# Patient Record
Sex: Male | Born: 1960 | Race: Black or African American | Hispanic: No | Marital: Married | Smoking: Never smoker
Health system: Southern US, Community
[De-identification: ages and names within clinical notes are randomized; demographics above are authoritative.]

## PROBLEM LIST (undated history)

## (undated) DIAGNOSIS — N529 Male erectile dysfunction, unspecified: Secondary | ICD-10-CM

## (undated) DIAGNOSIS — F32A Depression, unspecified: Secondary | ICD-10-CM

## (undated) DIAGNOSIS — I10 Essential (primary) hypertension: Secondary | ICD-10-CM

## (undated) DIAGNOSIS — T7840XA Allergy, unspecified, initial encounter: Secondary | ICD-10-CM

## (undated) DIAGNOSIS — E785 Hyperlipidemia, unspecified: Secondary | ICD-10-CM

## (undated) DIAGNOSIS — M1990 Unspecified osteoarthritis, unspecified site: Secondary | ICD-10-CM

## (undated) DIAGNOSIS — J45909 Unspecified asthma, uncomplicated: Secondary | ICD-10-CM

## (undated) HISTORY — PX: OTHER SURGICAL HISTORY: SHX169

## (undated) HISTORY — DX: Depression, unspecified: F32.A

## (undated) HISTORY — DX: Male erectile dysfunction, unspecified: N52.9

## (undated) HISTORY — DX: Allergy, unspecified, initial encounter: T78.40XA

## (undated) HISTORY — DX: Essential (primary) hypertension: I10

## (undated) HISTORY — PX: TONSILLECTOMY: SUR1361

## (undated) HISTORY — DX: Hyperlipidemia, unspecified: E78.5

## (undated) HISTORY — PX: WISDOM TOOTH EXTRACTION: SHX21

## (undated) HISTORY — DX: Unspecified asthma, uncomplicated: J45.909

## (undated) HISTORY — DX: Unspecified osteoarthritis, unspecified site: M19.90

---

## 2000-08-05 ENCOUNTER — Encounter: Payer: Self-pay | Admitting: Specialist

## 2000-08-05 ENCOUNTER — Encounter: Admission: RE | Admit: 2000-08-05 | Discharge: 2000-08-05 | Payer: Self-pay | Admitting: Specialist

## 2001-04-02 ENCOUNTER — Ambulatory Visit (HOSPITAL_COMMUNITY): Admission: RE | Admit: 2001-04-02 | Discharge: 2001-04-02 | Payer: Self-pay | Admitting: Specialist

## 2002-02-25 ENCOUNTER — Encounter: Admission: RE | Admit: 2002-02-25 | Discharge: 2002-05-26 | Payer: Self-pay | Admitting: Internal Medicine

## 2004-05-01 ENCOUNTER — Ambulatory Visit (HOSPITAL_BASED_OUTPATIENT_CLINIC_OR_DEPARTMENT_OTHER): Admission: RE | Admit: 2004-05-01 | Discharge: 2004-05-01 | Payer: Self-pay | Admitting: Specialist

## 2004-11-13 ENCOUNTER — Ambulatory Visit: Payer: Self-pay | Admitting: Internal Medicine

## 2005-01-08 ENCOUNTER — Ambulatory Visit: Payer: Self-pay | Admitting: Internal Medicine

## 2006-04-01 ENCOUNTER — Ambulatory Visit: Payer: Self-pay | Admitting: Internal Medicine

## 2006-04-15 ENCOUNTER — Ambulatory Visit: Payer: Self-pay | Admitting: Internal Medicine

## 2006-05-31 ENCOUNTER — Ambulatory Visit (HOSPITAL_BASED_OUTPATIENT_CLINIC_OR_DEPARTMENT_OTHER): Admission: RE | Admit: 2006-05-31 | Discharge: 2006-05-31 | Payer: Self-pay | Admitting: Specialist

## 2006-06-04 ENCOUNTER — Ambulatory Visit: Payer: Self-pay | Admitting: Internal Medicine

## 2006-06-11 ENCOUNTER — Ambulatory Visit: Payer: Self-pay | Admitting: Internal Medicine

## 2007-05-12 ENCOUNTER — Encounter (INDEPENDENT_AMBULATORY_CARE_PROVIDER_SITE_OTHER): Payer: Self-pay

## 2007-07-08 DIAGNOSIS — E1169 Type 2 diabetes mellitus with other specified complication: Secondary | ICD-10-CM | POA: Insufficient documentation

## 2007-07-08 DIAGNOSIS — E119 Type 2 diabetes mellitus without complications: Secondary | ICD-10-CM

## 2007-07-08 DIAGNOSIS — I10 Essential (primary) hypertension: Secondary | ICD-10-CM | POA: Insufficient documentation

## 2008-03-11 ENCOUNTER — Encounter: Payer: Self-pay | Admitting: Internal Medicine

## 2008-05-28 ENCOUNTER — Ambulatory Visit: Payer: Self-pay | Admitting: Internal Medicine

## 2008-05-28 LAB — CONVERTED CEMR LAB
ALT: 48 units/L (ref 0–53)
AST: 30 units/L (ref 0–37)
Albumin: 4.3 g/dL (ref 3.5–5.2)
Alkaline Phosphatase: 88 units/L (ref 39–117)
BUN: 15 mg/dL (ref 6–23)
Basophils Absolute: 0 10*3/uL (ref 0.0–0.1)
Basophils Relative: 0.5 % (ref 0.0–3.0)
Bilirubin Urine: NEGATIVE
Bilirubin, Direct: 0.1 mg/dL (ref 0.0–0.3)
Blood in Urine, dipstick: NEGATIVE
CO2: 30 meq/L (ref 19–32)
Calcium: 9.5 mg/dL (ref 8.4–10.5)
Chloride: 104 meq/L (ref 96–112)
Cholesterol: 148 mg/dL (ref 0–200)
Creatinine, Ser: 1.1 mg/dL (ref 0.4–1.5)
Eosinophils Absolute: 0.1 10*3/uL (ref 0.0–0.7)
Eosinophils Relative: 2.4 % (ref 0.0–5.0)
GFR calc Af Amer: 93 mL/min
GFR calc non Af Amer: 77 mL/min
Glucose, Bld: 137 mg/dL — ABNORMAL HIGH (ref 70–99)
Glucose, Urine, Semiquant: NEGATIVE
HCT: 39.5 % (ref 39.0–52.0)
HDL: 23.9 mg/dL — ABNORMAL LOW (ref >39.0)
Hemoglobin: 14.2 g/dL (ref 13.0–17.0)
Hgb A1c MFr Bld: 6.5 % — ABNORMAL HIGH (ref 4.6–6.0)
LDL Cholesterol: 109 mg/dL — ABNORMAL HIGH (ref 0–99)
Lymphocytes Relative: 25.3 % (ref 12.0–46.0)
MCHC: 35.8 g/dL (ref 30.0–36.0)
MCV: 94.9 fL (ref 78.0–100.0)
Monocytes Absolute: 0.4 10*3/uL (ref 0.1–1.0)
Monocytes Relative: 8.7 % (ref 3.0–12.0)
Neutro Abs: 2.6 10*3/uL (ref 1.4–7.7)
Neutrophils Relative %: 63.1 % (ref 43.0–77.0)
Nitrite: NEGATIVE
PSA: 1 ng/mL (ref 0.10–4.00)
Platelets: 229 10*3/uL (ref 150–400)
Potassium: 3.7 meq/L (ref 3.5–5.1)
Protein, U semiquant: NEGATIVE
RBC: 4.16 M/uL — ABNORMAL LOW (ref 4.22–5.81)
RDW: 11.9 % (ref 11.5–14.6)
Sodium: 142 meq/L (ref 135–145)
Specific Gravity, Urine: 1.01
TSH: 1.09 microintl units/mL (ref 0.35–5.50)
Total Bilirubin: 1.5 mg/dL — ABNORMAL HIGH (ref 0.3–1.2)
Total CHOL/HDL Ratio: 6.2
Total Protein: 7.5 g/dL (ref 6.0–8.3)
Triglycerides: 75 mg/dL (ref 0–149)
Urobilinogen, UA: 1
VLDL: 15 mg/dL (ref 0–40)
WBC Urine, dipstick: NEGATIVE
WBC: 4.2 10*3/uL — ABNORMAL LOW (ref 4.5–10.5)
pH: 6

## 2008-06-04 ENCOUNTER — Ambulatory Visit: Payer: Self-pay | Admitting: Internal Medicine

## 2008-07-14 ENCOUNTER — Telehealth: Payer: Self-pay | Admitting: Internal Medicine

## 2008-12-31 ENCOUNTER — Ambulatory Visit: Payer: Self-pay | Admitting: Internal Medicine

## 2008-12-31 LAB — CONVERTED CEMR LAB: Hgb A1c MFr Bld: 7.2 % — ABNORMAL HIGH (ref 4.6–6.5)

## 2009-01-13 ENCOUNTER — Ambulatory Visit: Payer: Self-pay | Admitting: Internal Medicine

## 2009-04-04 ENCOUNTER — Ambulatory Visit: Payer: Self-pay | Admitting: Internal Medicine

## 2009-04-04 LAB — CONVERTED CEMR LAB
BUN: 13 mg/dL (ref 6–23)
CO2: 32 meq/L (ref 19–32)
Calcium: 9.5 mg/dL (ref 8.4–10.5)
Chloride: 105 meq/L (ref 96–112)
Creatinine, Ser: 1.2 mg/dL (ref 0.4–1.5)
GFR calc non Af Amer: 83.42 mL/min (ref >60)
Glucose, Bld: 178 mg/dL — ABNORMAL HIGH (ref 70–99)
Hgb A1c MFr Bld: 6.2 % (ref 4.6–6.5)
Potassium: 4.8 meq/L (ref 3.5–5.1)
Sodium: 143 meq/L (ref 135–145)

## 2009-05-05 ENCOUNTER — Ambulatory Visit: Payer: Self-pay | Admitting: Internal Medicine

## 2009-09-07 ENCOUNTER — Ambulatory Visit: Payer: Self-pay | Admitting: Internal Medicine

## 2009-09-07 LAB — CONVERTED CEMR LAB
ALT: 39 units/L (ref 0–53)
AST: 24 units/L (ref 0–37)
Albumin: 4.1 g/dL (ref 3.5–5.2)
Alkaline Phosphatase: 90 units/L (ref 39–117)
Basophils Absolute: 0 10*3/uL (ref 0.0–0.1)
Basophils Relative: 0.5 % (ref 0.0–3.0)
Bilirubin, Direct: 0.1 mg/dL (ref 0.0–0.3)
Cholesterol: 149 mg/dL (ref 0–200)
Eosinophils Absolute: 0.1 10*3/uL (ref 0.0–0.7)
Eosinophils Relative: 3.2 % (ref 0.0–5.0)
HCT: 43.1 % (ref 39.0–52.0)
HDL: 27.6 mg/dL — ABNORMAL LOW (ref >39.00)
Hemoglobin: 14.6 g/dL (ref 13.0–17.0)
LDL Cholesterol: 99 mg/dL (ref 0–99)
Lymphocytes Relative: 34.8 % (ref 12.0–46.0)
Lymphs Abs: 1.3 10*3/uL (ref 0.7–4.0)
MCHC: 34 g/dL (ref 30.0–36.0)
MCV: 95.8 fL (ref 78.0–100.0)
Monocytes Absolute: 0.4 10*3/uL (ref 0.1–1.0)
Monocytes Relative: 11.4 % (ref 3.0–12.0)
Neutro Abs: 1.9 10*3/uL (ref 1.4–7.7)
Neutrophils Relative %: 50.1 % (ref 43.0–77.0)
PSA: 0.93 ng/mL (ref 0.10–4.00)
Platelets: 186 10*3/uL (ref 150.0–400.0)
RBC: 4.5 M/uL (ref 4.22–5.81)
RDW: 12.2 % (ref 11.5–14.6)
Total Bilirubin: 1.4 mg/dL — ABNORMAL HIGH (ref 0.3–1.2)
Total CHOL/HDL Ratio: 5
Total Protein: 7 g/dL (ref 6.0–8.3)
Triglycerides: 114 mg/dL (ref 0.0–149.0)
VLDL: 22.8 mg/dL (ref 0.0–40.0)
WBC: 3.7 10*3/uL — ABNORMAL LOW (ref 4.5–10.5)

## 2010-04-17 ENCOUNTER — Encounter: Payer: Self-pay | Admitting: Internal Medicine

## 2010-04-17 ENCOUNTER — Telehealth (INDEPENDENT_AMBULATORY_CARE_PROVIDER_SITE_OTHER): Payer: Self-pay | Admitting: *Deleted

## 2010-05-03 ENCOUNTER — Telehealth: Payer: Self-pay | Admitting: Internal Medicine

## 2010-05-03 ENCOUNTER — Ambulatory Visit: Payer: Self-pay | Admitting: Internal Medicine

## 2010-05-03 DIAGNOSIS — L600 Ingrowing nail: Secondary | ICD-10-CM | POA: Insufficient documentation

## 2010-11-09 NOTE — Letter (Signed)
Summary: Triage Note-Painful right toe  Triage Note-Painful right toe   Imported By: Maryln Gottron 05/02/2010 15:46:51  _____________________________________________________________________  External Attachment:    Type:   Image     Comment:   External Document

## 2010-11-09 NOTE — Assessment & Plan Note (Signed)
Summary: excision toenail/dm   Vital Signs:  Patient profile:   50 year old male Weight:      210 pounds Pulse rate:   89 / minute BP sitting:   130 / 100  (left arm) Cuff size:   regular CC: Right big toe nail infected, src   CC:  Right big toe nail infected and src.  History of Present Illness: pt had one week hx of painfull swollen toe HX of DM wears steel toed shoes at work the pain has not been   Problems Prior to Update: 1)  Preventive Health Care  (ICD-V70.0) 2)  Family History Diabetes 1st Degree Relative  (ICD-V18.0) 3)  Hypertension  (ICD-401.9) 4)  Diabetes Mellitus, Type II  (ICD-250.00)  Medications Prior to Update: 1)  Benicar Hct 20-12.5 Mg  Tabs (Olmesartan Medoxomil-Hctz) .Marland Kitchen.. 1 Once Daily 2)  Metformin Hcl 500 Mg Xr24h-Tab (Metformin Hcl) .... Two By Mouth Q Hs 3)  Keflex 500 Mg Caps (Cephalexin) .... One By Mouth Two Times A Day X 7 Days  Current Medications (verified): 1)  Benicar Hct 20-12.5 Mg  Tabs (Olmesartan Medoxomil-Hctz) .Marland Kitchen.. 1 Once Daily 2)  Metformin Hcl 500 Mg Xr24h-Tab (Metformin Hcl) .... Two By Mouth Q Hs  Allergies (verified): No Known Drug Allergies  Past History:  Family History: Last updated: 07/08/2007 Family History of Lupus Family History Diabetes 1st degree relative  Social History: Last updated: 07/08/2007 Never Smoked Alcohol use-no  Risk Factors: Smoking Status: never (07/08/2007)  Past medical, surgical, family and social histories (including risk factors) reviewed, and no changes noted (except as noted below).  Past Medical History: Reviewed history from 07/08/2007 and no changes required. Diabetes mellitus, type II Hypertension  Past Surgical History: Reviewed history from 07/08/2007 and no changes required. L Knee Surgery  Family History: Reviewed history from 07/08/2007 and no changes required. Family History of Lupus Family History Diabetes 1st degree relative  Social History: Reviewed history  from 07/08/2007 and no changes required. Never Smoked Alcohol use-no  Review of Systems  The patient denies anorexia, fever, weight loss, weight gain, vision loss, decreased hearing, hoarseness, chest pain, syncope, dyspnea on exertion, peripheral edema, prolonged cough, headaches, hemoptysis, abdominal pain, melena, hematochezia, severe indigestion/heartburn, hematuria, incontinence, genital sores, muscle weakness, suspicious skin lesions, transient blindness, difficulty walking, depression, unusual weight change, abnormal bleeding, enlarged lymph nodes, angioedema, and breast masses.    Physical Exam  General:  Well-developed,well-nourished,in no acute distress; alert,appropriate and cooperative throughout examination Head:  Normocephalic and atraumatic without obvious abnormalities. No apparent alopecia or balding. Eyes:  pupils equal and pupils round.   Ears:  R ear normal and L ear normal.   Nose:  no external deformity and no nasal discharge.   Lungs:  Normal respiratory effort, chest expands symmetrically. Lungs are clear to auscultation, no crackles or wheezes. Heart:  Normal rate and regular rhythm. S1 and S2 normal without gallop, murmur, click, rub or other extra sounds. Abdomen:  Bowel sounds positive,abdomen soft and non-tender without masses, organomegaly or hernias noted. Extremities:  infected toe nail on right foot   Impression & Recommendations:  Problem # 1:  INGROWN NAIL, INFECTED (ICD-703.0)  the site was cleated with betadyne and anesthesia was obtained with 1% lidocaine 1/4 of the nail was removed from the right great toe and the nail bed was cauterized The following medications were removed from the medication list:    Keflex 500 Mg Caps (Cephalexin) ..... One by mouth two times a day x 7  days  Discussed nail care. Schedule an appointment for partial nail plate removal.   Orders: Nail excision, partial or complete, permanent (11750)  Problem # 2:  DIABETES  MELLITUS, TYPE II (ICD-250.00)  single does antibiotic  His updated medication list for this problem includes:    Benicar Hct 20-12.5 Mg Tabs (Olmesartan medoxomil-hctz) .Marland Kitchen... 1 once daily    Metformin Hcl 500 Mg Xr24h-tab (Metformin hcl) .Marland Kitchen..Marland Kitchen Two by mouth q hs  Labs Reviewed: Creat: 1.2 (04/04/2009)    Reviewed HgBA1c results: 6.2 (04/04/2009)  7.2 (12/31/2008)  Complete Medication List: 1)  Benicar Hct 20-12.5 Mg Tabs (Olmesartan medoxomil-hctz) .Marland Kitchen.. 1 once daily 2)  Metformin Hcl 500 Mg Xr24h-tab (Metformin hcl) .... Two by mouth q hs  Patient Instructions: 1)  keep the bandage on tonight and the foot elevated 2)  take the antibiotics i gave you tonight with a meal

## 2010-11-09 NOTE — Progress Notes (Signed)
Summary: vicodin  Phone Note Call from Patient Call back at Home Phone [phone] Call back at [phone] c   Summary of Call: wants pain pills for toe.  Hurting bad.  Took [facility] Pain Relief ASA, but needs more.  [facility] [address]  NKDA.   Initial call taken by: Rudy Jew, RN,  May 03, 2010 4:52 PM  Follow-up for Phone Call        Per Dr. Shela Commons may have Vicodin 5-500 one by mouth q 6 hr as needed pain #20.   Wife says to call to [phone].  Done Follow-up by: Rudy Jew, RN,  May 03, 2010 5:04 PM    New/Updated Medications: VICODIN 5-500 MG TABS (HYDROCODONE-ACETAMINOPHEN) One by mouth every 6 hours as needed pain Prescriptions: VICODIN 5-500 MG TABS (HYDROCODONE-ACETAMINOPHEN) One by mouth every 6 hours as needed pain  #20 x 0   Entered by:   Rudy Jew, RN   Authorized by:   Stacie Glaze MD   Signed by:   Rudy Jew, RN on 05/03/2010   Method used:   Telephoned to ...       [facility] [address] [facility] [address] (retail)       [address] [place], [place]  [address]       Ph: [phone]       Fax: [phone]   RxID:   [id number]

## 2010-11-09 NOTE — Progress Notes (Signed)
  Phone Note Call from Patient   Caller: Pt walks in Call For: Stacie Glaze MD Summary of Call: Pt walks in with infected ingrown toenail. Per Dr. Lovell Sheehan :: Keflex 500 mg. one by mouth three times a day x 7 days and appt 2 weeks for excision of toenail. Initial call taken by: Lynann Beaver CMA,  April 17, 2010 9:12 AM    New/Updated Medications: KEFLEX 500 MG CAPS (CEPHALEXIN) one by mouth two times a day x 7 days Prescriptions: KEFLEX 500 MG CAPS (CEPHALEXIN) one by mouth two times a day x 7 days  #14 x 0   Entered by:   Lynann Beaver CMA   Authorized by:   Stacie Glaze MD   Signed by:   Lynann Beaver CMA on 04/17/2010   Method used:   Print then Give to Patient   RxID:   [id number] KEFLEX 500 MG CAPS (CEPHALEXIN) one by mouth two times a day x 7 days  #14 x 0   Entered by:   Lynann Beaver CMA   Authorized by:   Stacie Glaze MD   Signed by:   Lynann Beaver CMA on 04/17/2010   Method used:   Electronically to        [facility] [phone]* (retail)       [address]       [place], [place]  [address]       Ph: [phone]       Fax: [phone]   RxID:   [id number]

## 2010-11-14 ENCOUNTER — Other Ambulatory Visit: Payer: Self-pay | Admitting: Internal Medicine

## 2010-11-14 DIAGNOSIS — I10 Essential (primary) hypertension: Secondary | ICD-10-CM

## 2010-11-14 MED ORDER — OLMESARTAN MEDOXOMIL-HCTZ 20-12.5 MG PO TABS: 1.0000 | ORAL_TABLET | Freq: Every day | ORAL | Status: DC

## 2011-02-23 NOTE — Op Note (Signed)
[facility]  Patient:    Brent Cook, Brent Cook                      MRN: [id number] Proc. Date: 04/02/01 Adm. Date:  [id number] Attending:  Pierce Crane                           Operative Report  PREOPERATIVE DIAGNOSIS:  Post-traumatic chondromalacia left knee.  POSTOPERATIVE DIAGNOSES:  Post-traumatic chondromalacia left knee, post-traumatic chondromalacia of the femoral sulcus.  PROCEDURES:  Left knee arthroscopy, chondroplasty of the patella, femoral sulcus and medial femoral condyle, debridement of synovitis.  ANESTHESIA:  General.  BRIEF HISTORY:  This is the case of a 50 year old with refractory knee pain following a work-related injury. MRI indicating tricompartment osteoarthrosis and chondromalacia post-traumatic. The patient failed conservative treatment and therefore was a candidate for arthroscopic debridement. The risks and benefits were discussed including bleeding, infection, damage to neurovascular structures, no change in symptoms and need for repeat arthroscopic debridement in the future for persistent pain, etc.  TECHNIQUE:  With the patient in the supine position after induction of adequate general anesthesia and 1 gm of Kefzol, the left lower extremity was prepped and draped in the usual sterile fashion. A lateral parapatellar portal and the superomedial parapatellar portal was fashioned with a #11 blade. An ingress cannula atraumatically placed. Irrigant was utilized to insufflate the joint.  Arthroscopic camera was then inserted via the lateral portal and under direct visualization, a medial parapatellar portal was fashioned with a #11 blade sparing the medial meniscus. Inspection of the suprapatellar pouch revealed extensive grade 3 changes of the patella and of the femoral sulcus with chondral flap tears at the femoral sulcus both medially and laterally. The medial compartment revealed hypertrophic synovitis as well as  in the intercondylar notch. The meniscus was stable to probing and palpation without evidence of a tear. There was a flap tear on the medial femoral condyle posteriorly and centrally. ACL and PCL were unremarkable and stable to probing and palpation without evidence of tear. The lateral compartment revealed some grade 2 changes throughout a stable meniscus without a tear. The gutters were unremarkable. A 4/2 Kuda shaver was then introduced via the medial parapatellar portal utilizing the four chondroplasties of the patella, femoral sulcus and medial femoral condyle. A straight basket was utilized to remove chondral flap tears of the sulcus entering from the medial and lateral portal to optimize access. Next a full-radius resector was then introduced via the medial parapatellar portal and utilized to further refine the above mentioned chondroplasties. The knee was then copiously irrigated and all compartments reinspected. There was normal patellofemoral tracking. There was no further loose cartilaginous debris, flap tears, etc. The extensive changes, however, especially of the femoral sulcus and of the patella were of concern. There was no grade 4 changes; however, there was some minor cartilage present upon the femoral sulcus.  Next, the knee was copiously irrigated, all instrumentation was removed. The portals were closed with 4-0 nylon simple sutures, the wound was dressed sterilely, secured with an Ace bandage. The patient was awakened without difficulty and transported to the recovery room in satisfactory condition. The patient tolerated the procedure well with no complications. DD:  04/02/01 TD:  04/02/01 Job: [id number]

## 2011-02-23 NOTE — H&P (Signed)
[facility]  Patient:    Brent Cook, Brent Cook                           MRN: [id number] Adm. Date:  04/02/01 Attending:  Javier Docker, M.D. Dictator:   Alexzandrew L. Perkins, P.A.-C.                         History and Physical  CHIEF COMPLAINT:  Left knee pain.  HISTORY OF PRESENT ILLNESS:  The patient is a 50 year old male who has been seen and evaluated by Dr. Javier Docker for left knee pain.  He sustained injury back on the job when he was carrying 50 pound weights and was standing on a conveyer belt.  He felt a pop in his knee and since that time he has been having increasing knee pain.  He has undergone previous conservative treatment by Vira Browns in the past.  He has undergone anti-inflammatories and also intra-articular injections of steroids and also a treatment of hyalgan which only provided temporary relief.  He was sent for an MRI which demonstrated tricompartmental chondromalacia; no significant meniscal tear was appreciated on the MRI.  He was later referred to Dr. Shelle Iron for evaluation.  Due to the fact that he has not improved with conservative measures, it is felt that he may benefit from undergoing arthroscopic debridement.  Risks and benefits of the procedure were discussed with the patient, and he has elected to proceed with surgery.  ALLERGIES:  No known drug allergies.  CURRENT MEDICATIONS:  He does have medications for Vicodin 5 mg and also Vioxx 25 which he states he was scheduled to use these after surgery.  PAST MEDICAL HISTORY:  Childhood asthma.  PAST SURGICAL HISTORY:  Tonsillectomy.  SOCIAL HISTORY:  He is married, has one child, and denies tobacco products or alcohol products.  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  FAMILY HISTORY:  Father living, age 64 and mother living age 51.  Family history of hypertension.  REVIEW OF SYSTEMS:  GENERAL:  No fevers or chills or night sweats. NEUROLOGIC:  No  seizure, syncope, or paralysis.  RESPIRATORY:  No shortness of breath, productive cough, or hemoptysis.  CARDIOVASCULAR:  No chest pain or orthopnea.  GI:  No nausea or vomiting or constipation.  GU:  No dysuria, hematuria, or discharge.  MUSCULOSKELETAL:  Pertinent to the left knee found in the history of present illness.  PHYSICAL EXAMINATION:  VITAL SIGNS:  Pulse 64, respirations 16, blood pressure 132/80.  GENERAL:  The patient is a 50 year old male, well-developed, well-nourished, appears to be in no acute distress.  He is alert, oriented, and cooperative. A large framed, very muscular individual.  HEENT:  Normocephalic, atraumatic.  Pupils round and reactive.  Oropharynx is clear.  EOMs intact.  NECK:  Supple, no carotid bruits.  LUNGS:  Clear to auscultation and percussion, anterior and posterior.  HEART:  Regular rate and rhythm, no murmurs, S1, S2 noted, no rubs, thrills, or palpitations.  ABDOMEN:  Soft, slightly round, nontender, bowel sounds are present.  RECTAL/BREAST/GENITALIA:  Not done, not pertinent to present illness.  EXTREMITIES:  Significant to the left lower extremity.  The patient has range of motion of 0-140 degrees of flexion.  Negative Lachman, negative anterior/posterior drawer, negative McMurray.  Normal tracking patella with normal patella tilt.  He does ambulate slightly with an antalgic gait.  He does have  trace effusion.  No ecchymosis or deformity noted.  He does have some pain with patella femoral compression and also some pain over the medial joint line.  IMPRESSION:  Left knee osteoarthritis.  PLAN:  The patient will undergo a left knee arthroscopy and debridement. Surgery will be performed by Dr. Jene Every. DD:  04/01/01 TD:  04/01/01 Job: [id number]

## 2011-02-23 NOTE — Op Note (Signed)
NAME:  Brent Cook, Brent Cook                         ACCOUNT NO.:  [id number]   MEDICAL RECORD NO.:  [id number]                   PATIENT TYPE:  AMB   LOCATION:  [facility]                                 FACILITY:  [facility]   PHYSICIAN:  Jene Every, M.D.                 DATE OF BIRTH:  30-Nov-1960   DATE OF PROCEDURE:  05/01/2004  DATE OF DISCHARGE:                                 OPERATIVE REPORT   PREOPERATIVE DIAGNOSES:  1. Post-traumatic chondromalacia, left knee.  2. Loose cartilaginous bodies.   POSTOPERATIVE DIAGNOSES:  1. Post-traumatic chondromalacia, left knee.  2. Loose cartilaginous bodies.   PROCEDURES PERFORMED:  Left knee arthroscopy:, chondroplasty of the patella.  1. Chondroplasty of the femoral sulcus.  2. Chondroplasty of the medial femoral condyle.  3. Chondroplasty of the medial tibial plateau.  4. Removal of loose bodies.   ANESTHESIA:  General.   BRIEF HISTORY AND INDICATIONS:  This is a 50 year old with refractory knee  pain, status post trauma to his left knee with loose cartilaginous debris.  MRI indicating defect of the medial femoral condyle, patellofemoral  arthrosis was noted due to refractory pain.  The patient was indicated for  arthroscopic debridement.  Risks and benefits discussed, including bleeding,  infection, damage to vascular structures, no change in symptoms, worsening  of symptoms, need for repeat debridement, etc.   TECHNIQUE:  Patient in supine position.  After induction of adequate level  of general anesthesia, 1 g Kefzol, the left lower extremity was prepped and  draped in the usual sterile fashion.  A medial lateral peripatellar portal  and a superior medial parapatellar portal was fashioned with a #11 blade and  ingress cannula atraumatically placed.  Irrigant was utilized to insufflate  the joint.  Arthroscopic camera was then inserted via the lateral portal and  under direct visualization the medial parapatellar portal was fashioned  with  a #11 blade after localization with an 18-gauge needle, sparing the medial  meniscus.  Inspection in the suprapatellar pouch revealed extensive grade 3  changes of the femoral sulcus and of the patella.  There were loose  cartilaginous bodies noted.  Examination of the medial compartment revealed  extensive grade 3 and almost grade 4 lesion in the posterior weightbearing  surface of the femoral condyle.  There were some grade 3 changes of the  tibial plateau noted as well.  Mild fraying of the meniscus.  ACL and PCL  were unremarkable.  The lateral compartment revealed some mild loose  cartilaginous debris and some grade 2 changes throughout.  The gutters were  unremarkable.  A shaver was introduced, utilized to perform a chondroplasty  of the patella, the femoral sulcus, the medial femoral condyle, and of the  tibial plateau, all to stable bases.  The loose cartilaginous debris was  evacuated.  These measured approximately 2 x 3 mm.  Following this we probed  both menisci.  Each was stable to probe palpation without evidence of tears.  We revisited all compartments without any further loose cartilaginous  debris.  There was normal patellofemoral tracking.  We then removed all  instrumentation and closed the portals with 4-0 nylon simple suture, 0.25%  Marcaine with epinephrine  was infiltrated in the joint.  The wound was dressed sterilely.  He was then  awoken without difficulty and transported to the recovery room in  satisfactory condition.   The patient tolerated the procedure well with no complication.                                               Jene Every, M.D.    Cordelia Pen  D:  05/01/2004  T:  05/01/2004  Job:  [id number]

## 2011-02-23 NOTE — Op Note (Signed)
Brent Cook, Brent Cook NO.:  [id number]   MEDICAL RECORD NO.:  [id number]          PATIENT TYPE:  AMB   LOCATION:  [facility]                         FACILITY:  [facility]   PHYSICIAN:  Jene Every, M.D.    DATE OF BIRTH:  May 01, 1961   DATE OF PROCEDURE:  05/31/2006  DATE OF DISCHARGE:                                 OPERATIVE REPORT   PREOPERATIVE DIAGNOSIS:  Post traumatic chondromalacia of the left knee  patellofemoral joint, medial femoral condyle, loose bodies.   POSTOPERATIVE DIAGNOSIS:  Post traumatic chondromalacia of the left knee  patellofemoral joint, medial femoral condyle, loose bodies.   PROCEDURE PERFORMED:  Left knee arthroscopy, chondroplasty of the patella,  femoral sulcus, medial femoral condyle, evacuation of loose bodies.   ANESTHESIA:  General.   SURGEON:  Jene Every, M.D.   ASSISTANT:  None.   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  Initially, he had large loose cartilaginous bodies  removed from the knee.  He has been treated for post traumatic arthritis  following that.  He had a recent escalation of his pain that was refractory  to conservative treatment including corticosteroid injections, physical  therapy, time, rest, etc.  He was indicated for arthroscopic debridement,  evacuation of loose bodies, and chondroplasty.  The risks and benefits were  discussed including bleeding, infection, damage to neurovascular structures,  no change in symptoms, worsening of symptoms, need for repeat debridement,  or total knee arthroplasty in the future, etc.   SURGICAL TECHNIQUE:  With the patient in the supine position, after adequate  of adequate general anesthesia and 1 gram Kefzol, the left lower extremity  was prepped and draped in the usual sterile fashion.  A lateral parapatellar  portal and superomedial  parapatellar portal was fashioned with a #11 blade.  Ingress cannula atraumatically placed.  Irrigant was utilized to insufflate  the joint.  Under direct visualization, a medial parapatellar portal was  fashioned with a #11 blade after localization of an 18 gauge needle sparing  the medial meniscus.  Noted was loose cartilaginous bodies throughout,  particularly in the medial compartment and the suprapatellar pouch.  These  were evacuated.  There was considerable grade 3 chondromalacia and loose  cartilaginous debris.  This was debrided.  Chondroplasties were performed of  the patella and the femoral sulcus.  Chondroplasty was performed of the  medial femoral condyle and the medial tibial plateau.  There was  predominantly chondroplasty on the femoral condyle noted.  ACL and PCL were  unremarkable, though the notch was narrowed.  The lateral compartment  revealed normal femoral condyle, tibial plateau, and lateral meniscus stable  to profile patient without evidence of tear.  Medial meniscus was without  evidence of significant tearing, just mild fraying was noted.  The gutters  were unremarkable, as well.  There was normal patellofemoral tracking.  Slight synovitis.  After copious lavage, all instrumentation was removed.  The portals were closed with 4-0 nylon simple suture.  0.25% Marcaine  with  epinephrine was infiltrated in the joint wound.  It was dressed sterilely.  He was awakened without difficulty and transported to the recovery room in  satisfactory condition.  The patient tolerated the procedure well without  complications.      Jene Every, M.D.  Electronically Signed     JB/MEDQ  D:  05/31/2006  T:  05/31/2006  Job:  [id number]

## 2011-03-27 ENCOUNTER — Other Ambulatory Visit: Payer: Self-pay | Admitting: Internal Medicine

## 2011-08-03 ENCOUNTER — Other Ambulatory Visit: Payer: Self-pay | Admitting: Internal Medicine

## 2011-08-20 ENCOUNTER — Telehealth: Payer: Self-pay | Admitting: Internal Medicine

## 2011-08-20 MED ORDER — OSELTAMIVIR PHOSPHATE 75 MG PO CAPS: 75.0000 mg | ORAL_CAPSULE | Freq: Two times a day (BID) | ORAL | Status: AC

## 2011-08-20 NOTE — Telephone Encounter (Signed)
Per dr Lovell Sheehan call in tamilfu 75 bid for 7 days=-pt informed and med sent in

## 2011-08-20 NOTE — Telephone Encounter (Signed)
Pt went to the nurse at his office and was told he could possibly have the flu. Pt was directed to see his PCP, I informed pt we were fun but could get him in in the morning. pt wanted to have meds called in for him and requested you contacted him

## 2011-08-22 ENCOUNTER — Other Ambulatory Visit: Payer: Self-pay

## 2011-09-03 ENCOUNTER — Encounter: Payer: Self-pay | Admitting: Internal Medicine

## 2011-11-01 ENCOUNTER — Other Ambulatory Visit (INDEPENDENT_AMBULATORY_CARE_PROVIDER_SITE_OTHER): Payer: 59

## 2011-11-01 DIAGNOSIS — Z0000 Encounter for general adult medical examination without abnormal findings: Secondary | ICD-10-CM

## 2011-11-01 LAB — LIPID PANEL
HDL: 27.7 mg/dL — ABNORMAL LOW (ref >39.00)
LDL Cholesterol: 85 mg/dL (ref 0–99)
Total CHOL/HDL Ratio: 5

## 2011-11-01 LAB — POCT URINALYSIS DIPSTICK
Blood, UA: NEGATIVE
Ketones, UA: NEGATIVE
Protein, UA: NEGATIVE
Spec Grav, UA: 1.02
pH, UA: 5.5

## 2011-11-01 LAB — CBC WITH DIFFERENTIAL/PLATELET
Basophils Absolute: 0 10*3/uL (ref 0.0–0.1)
Basophils Relative: 0.6 % (ref 0.0–3.0)
Hemoglobin: 14.3 g/dL (ref 13.0–17.0)
Lymphocytes Relative: 28.4 % (ref 12.0–46.0)
Monocytes Relative: 8.2 % (ref 3.0–12.0)
Neutro Abs: 2.6 10*3/uL (ref 1.4–7.7)
RBC: 4.33 Mil/uL (ref 4.22–5.81)
RDW: 12.9 % (ref 11.5–14.6)

## 2011-11-01 LAB — HEPATIC FUNCTION PANEL
AST: 17 U/L (ref 0–37)
Albumin: 4.3 g/dL (ref 3.5–5.2)
Alkaline Phosphatase: 90 U/L (ref 39–117)

## 2011-11-01 LAB — BASIC METABOLIC PANEL
Calcium: 9.5 mg/dL (ref 8.4–10.5)
GFR: 103.03 mL/min (ref >60.00)
Sodium: 136 mEq/L (ref 135–145)

## 2011-11-01 LAB — MICROALBUMIN / CREATININE URINE RATIO: Microalb, Ur: 0.7 mg/dL (ref 0.0–1.9)

## 2011-11-07 ENCOUNTER — Encounter: Payer: Self-pay | Admitting: Internal Medicine

## 2011-11-07 ENCOUNTER — Ambulatory Visit (INDEPENDENT_AMBULATORY_CARE_PROVIDER_SITE_OTHER): Payer: 59 | Admitting: Internal Medicine

## 2011-11-07 VITALS — BP 130/74 | HR 88 | Temp 98.3°F | Resp 16 | Ht 67.0 in | Wt 200.0 lb

## 2011-11-07 DIAGNOSIS — I10 Essential (primary) hypertension: Secondary | ICD-10-CM

## 2011-11-07 DIAGNOSIS — E119 Type 2 diabetes mellitus without complications: Secondary | ICD-10-CM

## 2011-11-07 DIAGNOSIS — Z0000 Encounter for general adult medical examination without abnormal findings: Secondary | ICD-10-CM

## 2011-11-07 DIAGNOSIS — Z23 Encounter for immunization: Secondary | ICD-10-CM

## 2011-11-07 NOTE — Patient Instructions (Signed)
Cut out the cookies!

## 2011-11-07 NOTE — Progress Notes (Signed)
Subjective:    Patient ID: Brent Cook, male    DOB: 25-Jul-1961, 51 y.o.   MRN: [id number]  HPI The pt presents for a CPX. Brent Cook has a history of hypertension and diabetes. He has a family history of heart disease but no personal history of any heart disease he has been stable his blood pressure medicine and this continues to be stable and compliant with those medications.  Screening blood work for his physical was generally normal with the exception of an elevated blood glucose. Blood count liver functions kidney functions were all good A1c was 7.2 which is a little high last year his A1c was 6.2.  He admits to having had cookies prior to his blood work drawn I would also explain why his triglycerides were up which they normally are not.     Review of Systems  Constitutional: Negative for fever and fatigue.  HENT: Negative for hearing loss, congestion, neck pain and postnasal drip.   Eyes: Negative for discharge, redness and visual disturbance.  Respiratory: Negative for cough, shortness of breath and wheezing.   Cardiovascular: Negative for leg swelling.  Gastrointestinal: Negative for abdominal pain, constipation and abdominal distention.  Genitourinary: Negative for urgency and frequency.  Musculoskeletal: Negative for joint swelling and arthralgias.  Skin: Negative for color change and rash.  Neurological: Negative for weakness and light-headedness.  Hematological: Negative for adenopathy.  Psychiatric/Behavioral: Negative for behavioral problems.       Past Medical History  Diagnosis Date  . Diabetes mellitus   . Hypertension     History   Social History  . Marital Status: Married    Spouse Name: N/A    Number of Children: N/A  . Years of Education: N/A   Occupational History  . Not on file.   Social History Main Topics  . Smoking status: Never Smoker   . Smokeless tobacco: Not on file  . Alcohol Use: No  . Drug Use: No  . Sexually Active: Not on file     Other Topics Concern  . Not on file   Social History Narrative  . No narrative on file    No past surgical history on file.  No family history on file.  No Known Allergies  Current Outpatient Prescriptions on File Prior to Visit  Medication Sig Dispense Refill  . GLUCOPHAGE XR 500 MG 24 hr tablet TAKE 2 TABLETS BY MOUTH DAILY AT BEDTIME  180 tablet  0  . olmesartan-hydrochlorothiazide (BENICAR HCT) 20-12.5 MG per tablet Take 1 tablet by mouth daily.  30 tablet  11    BP 130/74  Pulse 88  Temp 98.3 F (36.8 C)  Resp 16  Ht 5\' 7"  (1.702 m)  Wt 200 lb (90.719 kg)  BMI 31.32 kg/m2    Objective:   Physical Exam  Nursing note and vitals reviewed. Constitutional: He appears well-developed and well-nourished.  HENT:  Head: Normocephalic and atraumatic.  Eyes: Conjunctivae are normal. Pupils are equal, round, and reactive to light.  Neck: Normal range of motion. Neck supple.  Cardiovascular: Normal rate and regular rhythm.   Pulmonary/Chest: Effort normal and breath sounds normal.  Abdominal: Soft. Bowel sounds are normal.          Assessment & Plan:   Patient presents for yearly preventative medicine examination.   all immunizations and health maintenance protocols were reviewed with the patient and they are up to date with these protocols.   screening laboratory values were reviewed with the patient including screening  of hyperlipidemia PSA renal function and hepatic function.   There medications past medical history social history problem list and allergies were reviewed in detail.   Goals were established with regard to weight loss exercise diet in compliance with medications  Patient admits to dietary noncompliance he has been eating chocolate chip cookies that explains why his blood glucose is higher and his hemoglobin A1c which as been in the mid-6 range is now 7.2.  We discussed dietary compliance and its importance in controlling his diabetes and  preventing complications from his diabetes.  His hypertension is stable his current medication his metabolic panel shows an excellent creatinine and potassium level on an ARB  type drug

## 2011-11-26 ENCOUNTER — Other Ambulatory Visit: Payer: Self-pay | Admitting: Internal Medicine

## 2011-12-10 ENCOUNTER — Telehealth: Payer: Self-pay | Admitting: Internal Medicine

## 2011-12-10 MED ORDER — VARDENAFIL HCL 20 MG PO TABS: 20.0000 mg | ORAL_TABLET | Freq: Every day | ORAL | Status: DC | PRN

## 2011-12-10 NOTE — Telephone Encounter (Signed)
Pt called and said that he was given samples of Levitra to try. Pt said that med works well and now is req a script to be called in to [facility] on [address] in [place].

## 2011-12-11 ENCOUNTER — Telehealth: Payer: Self-pay | Admitting: Internal Medicine

## 2011-12-11 NOTE — Telephone Encounter (Signed)
Pt called and said that insurance will not cover Levitra. Pt needs an alternative med that insurance will cover. Pts pharmacy told pt to contact pcp for different med.

## 2011-12-12 ENCOUNTER — Other Ambulatory Visit: Payer: Self-pay | Admitting: *Deleted

## 2011-12-12 MED ORDER — SILDENAFIL CITRATE 100 MG PO TABS: 100.0000 mg | ORAL_TABLET | Freq: Every day | ORAL | Status: DC | PRN

## 2011-12-17 ENCOUNTER — Other Ambulatory Visit: Payer: Self-pay | Admitting: Internal Medicine

## 2012-03-24 ENCOUNTER — Other Ambulatory Visit: Payer: Self-pay | Admitting: Internal Medicine

## 2012-04-29 ENCOUNTER — Other Ambulatory Visit: Payer: 59

## 2012-05-06 ENCOUNTER — Ambulatory Visit: Payer: 59 | Admitting: Internal Medicine

## 2012-07-07 ENCOUNTER — Other Ambulatory Visit: Payer: Self-pay | Admitting: Internal Medicine

## 2012-11-13 ENCOUNTER — Other Ambulatory Visit: Payer: Self-pay | Admitting: Internal Medicine

## 2012-11-14 ENCOUNTER — Telehealth: Payer: Self-pay | Admitting: Internal Medicine

## 2012-11-14 MED ORDER — CANDESARTAN CILEXETIL-HCTZ 32-12.5 MG PO TABS: 1.0000 | ORAL_TABLET | Freq: Every day | ORAL | Status: DC

## 2012-11-14 NOTE — Telephone Encounter (Signed)
Pt needs samples of benicar 20-12.5mg  also pt needs coupons to help with cost of med

## 2012-11-14 NOTE — Telephone Encounter (Signed)
$  117 the cost of Benicar.  Can they try something else?

## 2012-11-14 NOTE — Telephone Encounter (Signed)
None available, but will send in script to pharmacy .  Has not been seen>1 years- needs ov

## 2012-11-14 NOTE — Telephone Encounter (Signed)
Please change to atacand 32-12.5 1 qd Wife informed

## 2013-01-06 ENCOUNTER — Other Ambulatory Visit: Payer: 59

## 2013-01-13 ENCOUNTER — Encounter: Payer: 59 | Admitting: Family

## 2013-03-12 ENCOUNTER — Other Ambulatory Visit: Payer: Self-pay | Admitting: Internal Medicine

## 2013-03-16 ENCOUNTER — Other Ambulatory Visit (INDEPENDENT_AMBULATORY_CARE_PROVIDER_SITE_OTHER): Payer: 59

## 2013-03-16 DIAGNOSIS — Z0000 Encounter for general adult medical examination without abnormal findings: Secondary | ICD-10-CM

## 2013-03-16 LAB — BASIC METABOLIC PANEL
BUN: 12 mg/dL (ref 6–23)
Calcium: 9.6 mg/dL (ref 8.4–10.5)
GFR: 118.94 mL/min (ref >60.00)
Glucose, Bld: 186 mg/dL — ABNORMAL HIGH (ref 70–99)
Sodium: 137 mEq/L (ref 135–145)

## 2013-03-16 LAB — POCT URINALYSIS DIPSTICK
Bilirubin, UA: NEGATIVE
Glucose, UA: NEGATIVE
Ketones, UA: NEGATIVE
Leukocytes, UA: NEGATIVE
Protein, UA: NEGATIVE

## 2013-03-16 LAB — CBC WITH DIFFERENTIAL/PLATELET
Basophils Absolute: 0 10*3/uL (ref 0.0–0.1)
Lymphocytes Relative: 27.6 % (ref 12.0–46.0)
Monocytes Relative: 8.2 % (ref 3.0–12.0)
Platelets: 199 10*3/uL (ref 150.0–400.0)
RDW: 12.6 % (ref 11.5–14.6)
WBC: 3.7 10*3/uL — ABNORMAL LOW (ref 4.5–10.5)

## 2013-03-16 LAB — LIPID PANEL
Cholesterol: 140 mg/dL (ref 0–200)
HDL: 26.3 mg/dL — ABNORMAL LOW (ref >39.00)
VLDL: 12.6 mg/dL (ref 0.0–40.0)

## 2013-03-16 LAB — HEPATIC FUNCTION PANEL
AST: 22 U/L (ref 0–37)
Alkaline Phosphatase: 85 U/L (ref 39–117)
Bilirubin, Direct: 0.2 mg/dL (ref 0.0–0.3)
Total Bilirubin: 1.1 mg/dL (ref 0.3–1.2)

## 2013-03-16 LAB — MICROALBUMIN / CREATININE URINE RATIO: Creatinine,U: 146.3 mg/dL

## 2013-03-20 ENCOUNTER — Ambulatory Visit (INDEPENDENT_AMBULATORY_CARE_PROVIDER_SITE_OTHER): Payer: 59 | Admitting: Internal Medicine

## 2013-03-20 ENCOUNTER — Encounter: Payer: Self-pay | Admitting: Internal Medicine

## 2013-03-20 VITALS — BP 130/80 | HR 76 | Temp 98.2°F | Resp 16 | Ht 67.0 in | Wt 202.0 lb

## 2013-03-20 DIAGNOSIS — Z0000 Encounter for general adult medical examination without abnormal findings: Secondary | ICD-10-CM

## 2013-03-20 NOTE — Progress Notes (Signed)
Subjective:    Patient ID: Brent Cook, male    DOB: 08-Feb-1961, 52 y.o.   MRN: [id number]  HPI  Patient with hypertension and diabetes presents for his yearly annual examination as part of his examination hemoglobin A1c was drawn which was 6.5  Review of Systems  Constitutional: Negative for fever and fatigue.  HENT: Negative for hearing loss, congestion, neck pain and postnasal drip.   Eyes: Negative for discharge, redness and visual disturbance.  Respiratory: Negative for cough, shortness of breath and wheezing.   Cardiovascular: Negative for leg swelling.  Gastrointestinal: Negative for abdominal pain, constipation and abdominal distention.  Genitourinary: Negative for urgency and frequency.  Musculoskeletal: Negative for joint swelling and arthralgias.  Skin: Negative for color change and rash.  Neurological: Negative for weakness and light-headedness.  Hematological: Negative for adenopathy.  Psychiatric/Behavioral: Negative for behavioral problems.   Past Medical History  Diagnosis Date  . Diabetes mellitus   . Hypertension     History   Social History  . Marital Status: Married    Spouse Name: N/A    Number of Children: N/A  . Years of Education: N/A   Occupational History  . Not on file.   Social History Main Topics  . Smoking status: Never Smoker   . Smokeless tobacco: Not on file  . Alcohol Use: No  . Drug Use: No  . Sexually Active: Not on file   Other Topics Concern  . Not on file   Social History Narrative  . No narrative on file    History reviewed. No pertinent past surgical history.  No family history on file.  No Known Allergies  Current Outpatient Prescriptions on File Prior to Visit  Medication Sig Dispense Refill  . candesartan-hydrochlorothiazide (ATACAND HCT) 32-12.5 MG per tablet Take 1 tablet by mouth daily.  30 tablet  6  . metFORMIN (GLUCOPHAGE-XR) 500 MG 24 hr tablet TAKE 2 TABLETS BY MOUTH DAILY AT BEDTIME  180 tablet  3   . sildenafil (VIAGRA) 100 MG tablet Take 1 tablet (100 mg total) by mouth daily as needed for erectile dysfunction.  5 tablet  11   No current facility-administered medications on file prior to visit.    BP 130/80  Pulse 76  Temp(Src) 98.2 F (36.8 C)  Resp 16  Ht 5\' 7"  (1.702 m)  Wt 202 lb (91.627 kg)  BMI 31.63 kg/m2        Objective:   Physical Exam  Vitals reviewed. Constitutional: He is oriented to person, place, and time. He appears well-developed and well-nourished.  HENT:  Head: Normocephalic and atraumatic.  Eyes: Conjunctivae are normal. Pupils are equal, round, and reactive to light.  Neck: Normal range of motion. Neck supple.  Cardiovascular: Normal rate and regular rhythm.   Pulmonary/Chest: Effort normal and breath sounds normal.  Abdominal: Soft. Bowel sounds are normal.  Genitourinary: Rectum normal and prostate normal.  Neurological: He is alert and oriented to person, place, and time.  Skin: Skin is warm and dry.  Psychiatric: He has a normal mood and affect. His behavior is normal.          Assessment & Plan:  Has lost significant weight which is about his A1c to return to the normal range of 6.5 for well-controlled diabetes  His blood pressure is also well controlled on his current medications   Patient presents for yearly preventative medicine examination.   all immunizations and health maintenance protocols were reviewed with the patient and they are  up to date with these protocols.   screening laboratory values were reviewed with the patient including screening of hyperlipidemia PSA renal function and hepatic function.   There medications past medical history social history problem list and allergies were reviewed in detail.   Goals were established with regard to weight loss exercise diet in compliance with medications

## 2013-03-30 ENCOUNTER — Other Ambulatory Visit: Payer: Self-pay | Admitting: Internal Medicine

## 2013-04-01 ENCOUNTER — Other Ambulatory Visit: Payer: Self-pay | Admitting: Internal Medicine

## 2013-04-20 ENCOUNTER — Other Ambulatory Visit: Payer: 59

## 2013-04-20 ENCOUNTER — Encounter: Payer: 59 | Admitting: Internal Medicine

## 2013-04-27 ENCOUNTER — Encounter: Payer: 59 | Admitting: Internal Medicine

## 2013-09-21 ENCOUNTER — Ambulatory Visit: Payer: 59 | Admitting: Internal Medicine

## 2014-01-07 ENCOUNTER — Telehealth: Payer: Self-pay

## 2014-01-07 NOTE — Telephone Encounter (Signed)
Pt req to speak with Dr Arnoldo Morale concerning some paper work that he needs to have filled out .

## 2014-01-11 NOTE — Telephone Encounter (Signed)
Left message on machine for patient to call back with more information about paperwork

## 2014-01-11 NOTE — Telephone Encounter (Signed)
Please find out the nature of the paperwork

## 2014-01-11 NOTE — Telephone Encounter (Signed)
Paperwork is concerning disibilty for the the [facility]. 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 Pt would like dr Arnoldo Morale to fill out paperwork . Pt would like to be checked out and get Dr's opinion on his these issues. Pt will drop of paperwork today or tomorrow.

## 2014-01-14 NOTE — Telephone Encounter (Signed)
Spoke with pt, he is dropping off his forms today 01/14/14

## 2014-02-11 LAB — HM DIABETES EYE EXAM

## 2014-02-15 ENCOUNTER — Other Ambulatory Visit: Payer: Self-pay | Admitting: Internal Medicine

## 2014-03-09 ENCOUNTER — Encounter: Payer: Self-pay | Admitting: Internal Medicine

## 2014-04-02 ENCOUNTER — Other Ambulatory Visit: Payer: Self-pay | Admitting: Internal Medicine

## 2014-05-05 ENCOUNTER — Other Ambulatory Visit: Payer: Self-pay | Admitting: Internal Medicine

## 2014-05-11 ENCOUNTER — Telehealth: Payer: Self-pay | Admitting: Internal Medicine

## 2014-05-11 MED ORDER — OLMESARTAN MEDOXOMIL-HCTZ 20-12.5 MG PO TABS: ORAL_TABLET | ORAL | Status: DC

## 2014-05-11 MED ORDER — METFORMIN HCL ER 500 MG PO TB24: ORAL_TABLET | ORAL | Status: DC

## 2014-05-11 NOTE — Telephone Encounter (Signed)
rx sent in electronically 

## 2014-05-11 NOTE — Telephone Encounter (Signed)
Pt is transferring to [place], but is out of BENICAR HCT 20-12.5 MG per tablet And  metFORMIN (GLUCOPHAGE-XR) 500 MG 24 hr tablet [facility] [address] Can you send in 30 days to get him to his appt?

## 2014-05-28 ENCOUNTER — Other Ambulatory Visit (INDEPENDENT_AMBULATORY_CARE_PROVIDER_SITE_OTHER): Payer: Managed Care, Other (non HMO)

## 2014-05-28 DIAGNOSIS — Z0000 Encounter for general adult medical examination without abnormal findings: Secondary | ICD-10-CM

## 2014-05-28 LAB — HEMOGLOBIN A1C: Hgb A1c MFr Bld: 6.9 % — ABNORMAL HIGH (ref 4.6–6.5)

## 2014-05-28 LAB — CBC WITH DIFFERENTIAL/PLATELET
BASOS ABS: 0 10*3/uL (ref 0.0–0.1)
Basophils Relative: 0.5 % (ref 0.0–3.0)
EOS ABS: 0.1 10*3/uL (ref 0.0–0.7)
Eosinophils Relative: 2.3 % (ref 0.0–5.0)
HCT: 38.4 % — ABNORMAL LOW (ref 39.0–52.0)
Hemoglobin: 13.4 g/dL (ref 13.0–17.0)
LYMPHS PCT: 31.5 % (ref 12.0–46.0)
Lymphs Abs: 1.2 10*3/uL (ref 0.7–4.0)
MCHC: 34.9 g/dL (ref 30.0–36.0)
MCV: 93.2 fl (ref 78.0–100.0)
MONOS PCT: 8.2 % (ref 3.0–12.0)
Monocytes Absolute: 0.3 10*3/uL (ref 0.1–1.0)
NEUTROS PCT: 57.5 % (ref 43.0–77.0)
Neutro Abs: 2.3 10*3/uL (ref 1.4–7.7)
PLATELETS: 208 10*3/uL (ref 150.0–400.0)
RBC: 4.11 Mil/uL — ABNORMAL LOW (ref 4.22–5.81)
RDW: 12.8 % (ref 11.5–15.5)
WBC: 3.9 10*3/uL — ABNORMAL LOW (ref 4.0–10.5)

## 2014-05-28 LAB — HEPATIC FUNCTION PANEL
ALT: 28 U/L (ref 0–53)
AST: 19 U/L (ref 0–37)
Albumin: 4.1 g/dL (ref 3.5–5.2)
Alkaline Phosphatase: 90 U/L (ref 39–117)
BILIRUBIN DIRECT: 0.2 mg/dL (ref 0.0–0.3)
TOTAL PROTEIN: 7.1 g/dL (ref 6.0–8.3)
Total Bilirubin: 1.1 mg/dL (ref 0.2–1.2)

## 2014-05-28 LAB — POCT URINALYSIS DIPSTICK
BILIRUBIN UA: NEGATIVE
GLUCOSE UA: NEGATIVE
Ketones, UA: NEGATIVE
LEUKOCYTES UA: NEGATIVE
NITRITE UA: NEGATIVE
PH UA: 5.5
Protein, UA: NEGATIVE
RBC UA: NEGATIVE
Spec Grav, UA: 1.01
UROBILINOGEN UA: 2

## 2014-05-28 LAB — BASIC METABOLIC PANEL
BUN: 15 mg/dL (ref 6–23)
CALCIUM: 9.4 mg/dL (ref 8.4–10.5)
CO2: 30 meq/L (ref 19–32)
CREATININE: 0.9 mg/dL (ref 0.4–1.5)
Chloride: 100 mEq/L (ref 96–112)
GFR: 115.32 mL/min (ref >60.00)
GLUCOSE: 120 mg/dL — AB (ref 70–99)
Potassium: 3.7 mEq/L (ref 3.5–5.1)
SODIUM: 137 meq/L (ref 135–145)

## 2014-05-28 LAB — LIPID PANEL
CHOL/HDL RATIO: 5
Cholesterol: 138 mg/dL (ref 0–200)
HDL: 28 mg/dL — ABNORMAL LOW (ref >39.00)
LDL Cholesterol: 99 mg/dL (ref 0–99)
NONHDL: 110
Triglycerides: 57 mg/dL (ref 0.0–149.0)
VLDL: 11.4 mg/dL (ref 0.0–40.0)

## 2014-05-29 LAB — MICROALBUMIN / CREATININE URINE RATIO
Creatinine,U: 98 mg/dL
MICROALB/CREAT RATIO: 0.3 mg/g (ref 0.0–30.0)
Microalb, Ur: 0.3 mg/dL (ref 0.0–1.9)

## 2014-05-29 LAB — PSA: PSA: 1.09 ng/mL (ref 0.10–4.00)

## 2014-05-29 LAB — TSH: TSH: 0.9 u[IU]/mL (ref 0.35–4.50)

## 2014-06-04 ENCOUNTER — Ambulatory Visit (INDEPENDENT_AMBULATORY_CARE_PROVIDER_SITE_OTHER): Payer: Managed Care, Other (non HMO) | Admitting: Family

## 2014-06-04 ENCOUNTER — Encounter: Payer: Self-pay | Admitting: Family

## 2014-06-04 VITALS — BP 130/82 | HR 85 | Ht 67.0 in | Wt 201.8 lb

## 2014-06-04 DIAGNOSIS — I10 Essential (primary) hypertension: Secondary | ICD-10-CM

## 2014-06-04 DIAGNOSIS — E119 Type 2 diabetes mellitus without complications: Secondary | ICD-10-CM

## 2014-06-04 DIAGNOSIS — E7800 Pure hypercholesterolemia, unspecified: Secondary | ICD-10-CM

## 2014-06-04 DIAGNOSIS — Z23 Encounter for immunization: Secondary | ICD-10-CM

## 2014-06-04 DIAGNOSIS — Z0000 Encounter for general adult medical examination without abnormal findings: Secondary | ICD-10-CM

## 2014-06-04 NOTE — Patient Instructions (Signed)
Colonoscopy  A colonoscopy is an exam to look at the entire large intestine (colon). This exam can help find problems such as tumors, polyps, inflammation, and areas of bleeding. The exam takes about 1 hour.   LET YOUR HEALTH CARE PROVIDER KNOW ABOUT:   · Any allergies you have.  · All medicines you are taking, including vitamins, herbs, eye drops, creams, and over-the-counter medicines.  · Previous problems you or members of your family have had with the use of anesthetics.  · Any blood disorders you have.  · Previous surgeries you have had.  · Medical conditions you have.  RISKS AND COMPLICATIONS   Generally, this is a safe procedure. However, as with any procedure, complications can occur. Possible complications include:  · Bleeding.  · Tearing or rupture of the colon wall.  · Reaction to medicines given during the exam.  · Infection (rare).  BEFORE THE PROCEDURE   · Ask your health care provider about changing or stopping your regular medicines.  · You may be prescribed an oral bowel prep. This involves drinking a large amount of medicated liquid, starting the day before your procedure. The liquid will cause you to have multiple loose stools until your stool is almost clear or light green. This cleans out your colon in preparation for the procedure.  · Do not eat or drink anything else once you have started the bowel prep, unless your health care provider tells you it is safe to do so.  · Arrange for someone to drive you home after the procedure.  PROCEDURE   · You will be given medicine to help you relax (sedative).  · You will lie on your side with your knees bent.  · A long, flexible tube with a light and camera on the end (colonoscope) will be inserted through the rectum and into the colon. The camera sends video back to a computer screen as it moves through the colon. The colonoscope also releases carbon dioxide gas to inflate the colon. This helps your health care provider see the area better.  · During  the exam, your health care provider may take a small tissue sample (biopsy) to be examined under a microscope if any abnormalities are found.  · The exam is finished when the entire colon has been viewed.  AFTER THE PROCEDURE   · Do not drive for 24 hours after the exam.  · You may have a small amount of blood in your stool.  · You may pass moderate amounts of gas and have mild abdominal cramping or bloating. This is caused by the gas used to inflate your colon during the exam.  · Ask when your test results will be ready and how you will get your results. Make sure you get your test results.  Document Released: 09/21/2000 Document Revised: 07/15/2013 Document Reviewed: 06/01/2013  ExitCare® Patient Information ©2015 [employer]. This information is not intended to replace advice given to you by your health care provider. Make sure you discuss any questions you have with your health care provider.

## 2014-06-04 NOTE — Progress Notes (Signed)
Subjective:    Patient ID: Brent Cook, male    DOB: 03-09-1961, 53 y.o.   MRN: [id number]  HPI 53 year old AAM, nonsmoker, is in today for CPX. Denies any concerns. Patient presents for yearly preventative medicine examination.   All immunizations and health maintenance protocols were reviewed with the patient and needed orders were placed.  Appropriate screening laboratory values were ordered for the patient including screening of hyperlipidemia, renal function and hepatic function. If indicated by BPH, a PSA was ordered.  Medication reconciliation,  past medical history, social history, problem list and allergies were reviewed in detail with the patient  Goals were established with regard to weight loss, exercise, and  diet in compliance with medications  End of life planning was discussed.     Review of Systems  Constitutional: Negative.   HENT: Negative.   Eyes: Negative.   Respiratory: Negative.   Cardiovascular: Negative.   Gastrointestinal: Negative.   Endocrine: Negative.   Genitourinary: Negative.   Musculoskeletal: Negative.   Skin: Negative.   Allergic/Immunologic: Negative.   Neurological: Negative.   Hematological: Negative.   Psychiatric/Behavioral: Negative.    Past Medical History  Diagnosis Date  . Diabetes mellitus   . Hypertension     History   Social History  . Marital Status: Married    Spouse Name: N/A    Number of Children: N/A  . Years of Education: N/A   Occupational History  . Not on file.   Social History Main Topics  . Smoking status: Never Smoker   . Smokeless tobacco: Not on file  . Alcohol Use: No  . Drug Use: No  . Sexual Activity: Not on file   Other Topics Concern  . Not on file   Social History Narrative  . No narrative on file    History reviewed. No pertinent past surgical history.  No family history on file.  No Known Allergies  Current Outpatient Prescriptions on File Prior to Visit  Medication  Sig Dispense Refill  . metFORMIN (GLUCOPHAGE-XR) 500 MG 24 hr tablet TAKE 2 TABLETS BY MOUTH DAILY AT BEDTIME  60 tablet  0  . olmesartan-hydrochlorothiazide (BENICAR HCT) 20-12.5 MG per tablet TAKE 1 TABLET BY MOUTH EVERY DAY  30 tablet  0  . VIAGRA 100 MG tablet TAKE 1 TABLET (100 MG TOTAL) BY MOUTH DAILY AS NEEDED FOR ERECTILE DYSFUNCTION.  5 tablet  3   No current facility-administered medications on file prior to visit.    BP 130/82  Pulse 85  Ht 5\' 7"  (1.702 m)  Wt 201 lb 12.8 oz (91.536 kg)  BMI 31.60 kg/m2chart    Objective:   Physical Exam  Constitutional: He is oriented to person, place, and time. He appears well-developed and well-nourished.  HENT:  Head: Normocephalic.  Right Ear: External ear normal.  Left Ear: External ear normal.  Nose: Nose normal.  Mouth/Throat: Oropharynx is clear and moist.  Eyes: Conjunctivae and EOM are normal. Pupils are equal, round, and reactive to light.  Neck: Normal range of motion. Neck supple. No thyromegaly present.  Cardiovascular: Normal rate, regular rhythm and normal heart sounds.   Pulmonary/Chest: Effort normal and breath sounds normal.  Abdominal: Soft. Bowel sounds are normal.  Genitourinary: Rectum normal, prostate normal and penis normal. Guaiac negative stool. No penile tenderness.  Musculoskeletal: Normal range of motion. He exhibits no edema and no tenderness.  Neurological: He is alert and oriented to person, place, and time. He has normal reflexes. He  displays normal reflexes. No cranial nerve deficit. Coordination normal.  Skin: Skin is warm and dry.  Psychiatric: He has a normal mood and affect.          Assessment & Plan:  Brent Cook was seen today for transfer from [place].  Diagnoses and associated orders for this visit:  Preventative health care - Ambulatory referral to Gastroenterology  Type II or unspecified type diabetes mellitus without mention of complication, not stated as uncontrolled  Pure  hypercholesterolemia  Unspecified essential hypertension  Need for prophylactic vaccination against Streptococcus pneumoniae (pneumococcus) - Pneumococcal conjugate vaccine 13-valent   Call the office with any questions or concerns. Recheck in 6 months and sooner as needed.

## 2014-06-04 NOTE — Progress Notes (Signed)
Pre visit review using our clinic review tool, if applicable. No additional management support is needed unless otherwise documented below in the visit note. 

## 2014-06-06 ENCOUNTER — Other Ambulatory Visit: Payer: Self-pay | Admitting: Internal Medicine

## 2014-06-07 ENCOUNTER — Telehealth: Payer: Self-pay | Admitting: Family

## 2014-06-07 NOTE — Telephone Encounter (Signed)
emmi mailed to patient °

## 2014-06-24 ENCOUNTER — Other Ambulatory Visit: Payer: Self-pay | Admitting: Internal Medicine

## 2014-07-09 ENCOUNTER — Encounter: Payer: Self-pay | Admitting: Family

## 2014-07-12 ENCOUNTER — Encounter: Payer: Self-pay | Admitting: Family

## 2014-09-20 ENCOUNTER — Other Ambulatory Visit: Payer: Self-pay | Admitting: Family

## 2014-10-12 ENCOUNTER — Ambulatory Visit (INDEPENDENT_AMBULATORY_CARE_PROVIDER_SITE_OTHER): Payer: Self-pay | Admitting: Family

## 2014-10-12 ENCOUNTER — Ambulatory Visit (INDEPENDENT_AMBULATORY_CARE_PROVIDER_SITE_OTHER): Payer: Self-pay

## 2014-10-12 ENCOUNTER — Encounter: Payer: Self-pay | Admitting: Family

## 2014-10-12 VITALS — BP 128/78 | HR 74 | Temp 97.3°F | Wt 202.0 lb

## 2014-10-12 DIAGNOSIS — I10 Essential (primary) hypertension: Secondary | ICD-10-CM

## 2014-10-12 DIAGNOSIS — E119 Type 2 diabetes mellitus without complications: Secondary | ICD-10-CM

## 2014-10-12 DIAGNOSIS — Z23 Encounter for immunization: Secondary | ICD-10-CM

## 2014-10-12 MED ORDER — TADALAFIL 20 MG PO TABS: 10.0000 mg | ORAL_TABLET | ORAL | Status: DC | PRN

## 2014-10-12 NOTE — Progress Notes (Signed)
Pre visit review using our clinic review tool, if applicable. No additional management support is needed unless otherwise documented below in the visit note. 

## 2014-10-12 NOTE — Patient Instructions (Signed)
Diabetes and Exercise Exercising regularly is important. It is not just about losing weight. It has many health benefits, such as:  Improving your overall fitness, flexibility, and endurance.  Increasing your bone density.  Helping with weight control.  Decreasing your body fat.  Increasing your muscle strength.  Reducing stress and tension.  Improving your overall health. People with diabetes who exercise gain additional benefits because exercise:  Reduces appetite.  Improves the body's use of blood sugar (glucose).  Helps lower or control blood glucose.  Decreases blood pressure.  Helps control blood lipids (such as cholesterol and triglycerides).  Improves the body's use of the hormone insulin by:  Increasing the body's insulin sensitivity.  Reducing the body's insulin needs.  Decreases the risk for heart disease because exercising:  Lowers cholesterol and triglycerides levels.  Increases the levels of good cholesterol (such as high-density lipoproteins [HDL]) in the body.  Lowers blood glucose levels. YOUR ACTIVITY PLAN  Choose an activity that you enjoy and set realistic goals. Your health care provider or diabetes educator can help you make an activity plan that works for you. Exercise regularly as directed by your health care provider. This includes:  Performing resistance training twice a week such as push-ups, sit-ups, lifting weights, or using resistance bands.  Performing 150 minutes of cardio exercises each week such as walking, running, or playing sports.  Staying active and spending no more than 90 minutes at one time being inactive. Even short bursts of exercise are good for you. Three 10-minute sessions spread throughout the day are just as beneficial as a single 30-minute session. Some exercise ideas include:  Taking the dog for a walk.  Taking the stairs instead of the elevator.  Dancing to your favorite song.  Doing an exercise  video.  Doing your favorite exercise with a friend. RECOMMENDATIONS FOR EXERCISING WITH TYPE 1 OR TYPE 2 DIABETES   Check your blood glucose before exercising. If blood glucose levels are greater than 240 mg/dL, check for urine ketones. Do not exercise if ketones are present.  Avoid injecting insulin into areas of the body that are going to be exercised. For example, avoid injecting insulin into:  The arms when playing tennis.  The legs when jogging.  Keep a record of:  Food intake before and after you exercise.  Expected peak times of insulin action.  Blood glucose levels before and after you exercise.  The type and amount of exercise you have done.  Review your records with your health care provider. Your health care provider will help you to develop guidelines for adjusting food intake and insulin amounts before and after exercising.  If you take insulin or oral hypoglycemic agents, watch for signs and symptoms of hypoglycemia. They include:  Dizziness.  Shaking.  Sweating.  Chills.  Confusion.  Drink plenty of water while you exercise to prevent dehydration or heat stroke. Body water is lost during exercise and must be replaced.  Talk to your health care provider before starting an exercise program to make sure it is safe for you. Remember, almost any type of activity is better than none. Document Released: 12/15/2003 Document Revised: 02/08/2014 Document Reviewed: 03/03/2013 ExitCare Patient Information 2015 [employer]. This information is not intended to replace advice given to you by your health care provider. Make sure you discuss any questions you have with your health care provider.  

## 2014-10-13 NOTE — Progress Notes (Signed)
   Subjective:    Patient ID: Brent Cook, male    DOB: 01-22-1961, 54 y.o.   MRN: [id number]  HPI 54 year old African-American male, nonsmoker with a history of type 2 diabetes, erectile dysfunction, hypertension and allergic rhinitis. Reports doing well. Would like to switch from Viagra to Cialis because his insurance will no longer cover Viagra. Tolerates medication well. Exercises routinely. Diabetic eye exam up-to-date. Does nightly feet checks.   Review of Systems  Constitutional: Negative.   HENT: Negative.   Respiratory: Negative.   Cardiovascular: Negative.   Gastrointestinal: Negative.   Endocrine: Negative.  Negative for cold intolerance and heat intolerance.  Genitourinary: Negative.   Musculoskeletal: Negative.   Skin: Negative.   Allergic/Immunologic: Negative.   Neurological: Negative.   Hematological: Negative.   Psychiatric/Behavioral: Negative.   All other systems reviewed and are negative.      Objective:   Physical Exam  Constitutional: He is oriented to person, place, and time. He appears well-developed and well-nourished.  HENT:  Right Ear: External ear normal.  Left Ear: External ear normal.  Nose: Nose normal.  Mouth/Throat: Oropharynx is clear and moist.  Eyes: Pupils are equal, round, and reactive to light.  Neck: Normal range of motion. Neck supple. No thyromegaly present.  Cardiovascular: Normal rate, regular rhythm and normal heart sounds.   Pulmonary/Chest: Effort normal and breath sounds normal.  Abdominal: Soft. Bowel sounds are normal.  Musculoskeletal: Normal range of motion.  Neurological: He is alert and oriented to person, place, and time.  Skin: Skin is warm and dry.  Psychiatric: He has a normal mood and affect.          Assessment & Plan:  Shamal was seen today for follow-up.  Diagnoses and associated orders for this visit:  Type 2 diabetes mellitus without complication - Basic Metabolic Panel; Future  Essential  hypertension - Hepatic Function Panel; Future - Hemoglobin A1c; Future  Need for prophylactic vaccination against Streptococcus pneumoniae (pneumococcus) - Pneumococcal polysaccharide vaccine 23-valent greater than or equal to 2yo subcutaneous/IM  Other Orders - tadalafil (CIALIS) 20 MG tablet; Take 0.5-1 tablets (10-20 mg total) by mouth every other day as needed for erectile dysfunction.    Encouraged healthy diet, exercise, check blood glucose. Recheck in 4 months. Advise annual eye exams, nightly feet checks

## 2014-11-30 ENCOUNTER — Other Ambulatory Visit (INDEPENDENT_AMBULATORY_CARE_PROVIDER_SITE_OTHER): Payer: Self-pay

## 2014-11-30 DIAGNOSIS — I10 Essential (primary) hypertension: Secondary | ICD-10-CM

## 2014-11-30 DIAGNOSIS — E119 Type 2 diabetes mellitus without complications: Secondary | ICD-10-CM

## 2014-11-30 LAB — HEPATIC FUNCTION PANEL
ALK PHOS: 103 U/L (ref 39–117)
ALT: 25 U/L (ref 0–53)
AST: 16 U/L (ref 0–37)
Albumin: 4.5 g/dL (ref 3.5–5.2)
Bilirubin, Direct: 0.2 mg/dL (ref 0.0–0.3)
TOTAL PROTEIN: 7.8 g/dL (ref 6.0–8.3)
Total Bilirubin: 0.9 mg/dL (ref 0.2–1.2)

## 2014-11-30 LAB — BASIC METABOLIC PANEL
BUN: 12 mg/dL (ref 6–23)
CO2: 31 mEq/L (ref 19–32)
CREATININE: 0.99 mg/dL (ref 0.40–1.50)
Calcium: 9.7 mg/dL (ref 8.4–10.5)
Chloride: 99 mEq/L (ref 96–112)
GFR: 101.78 mL/min (ref >60.00)
Glucose, Bld: 236 mg/dL — ABNORMAL HIGH (ref 70–99)
POTASSIUM: 3.6 meq/L (ref 3.5–5.1)
SODIUM: 136 meq/L (ref 135–145)

## 2014-11-30 LAB — HEMOGLOBIN A1C: Hgb A1c MFr Bld: 7 % — ABNORMAL HIGH (ref 4.6–6.5)

## 2014-12-06 ENCOUNTER — Ambulatory Visit: Payer: Managed Care, Other (non HMO) | Admitting: Family

## 2015-02-11 ENCOUNTER — Ambulatory Visit (INDEPENDENT_AMBULATORY_CARE_PROVIDER_SITE_OTHER): Payer: Managed Care, Other (non HMO) | Admitting: Family Medicine

## 2015-02-11 DIAGNOSIS — R69 Illness, unspecified: Secondary | ICD-10-CM

## 2015-02-11 NOTE — Progress Notes (Signed)
NO SHOW for NPV.

## 2015-02-22 ENCOUNTER — Other Ambulatory Visit: Payer: Self-pay | Admitting: Family

## 2015-04-19 ENCOUNTER — Telehealth: Payer: Self-pay | Admitting: Behavioral Health

## 2015-04-19 ENCOUNTER — Encounter: Payer: Self-pay | Admitting: Behavioral Health

## 2015-04-19 NOTE — Telephone Encounter (Signed)
Pre-Visit Call completed with patient and chart updated.   Pre-Visit Info documented in Specialty Comments under [employer].    

## 2015-04-20 ENCOUNTER — Ambulatory Visit (INDEPENDENT_AMBULATORY_CARE_PROVIDER_SITE_OTHER): Payer: Self-pay | Admitting: Physician Assistant

## 2015-04-20 NOTE — Progress Notes (Signed)
Erroneous encounter. Please disregard.

## 2015-04-20 NOTE — Progress Notes (Signed)
Pre visit review using our clinic review tool, if applicable. No additional management support is needed unless otherwise documented below in the visit note. 

## 2015-04-21 ENCOUNTER — Telehealth: Payer: Self-pay | Admitting: Physician Assistant

## 2015-04-21 ENCOUNTER — Encounter: Payer: Self-pay | Admitting: Physician Assistant

## 2015-04-21 NOTE — Telephone Encounter (Signed)
Pt came in office 04/20/15 and had insurance not active-pt rescheduled 05/16/2015

## 2015-05-13 ENCOUNTER — Encounter: Payer: Self-pay | Admitting: Behavioral Health

## 2015-05-13 ENCOUNTER — Telehealth: Payer: Self-pay | Admitting: Behavioral Health

## 2015-05-13 NOTE — Telephone Encounter (Signed)
Pre-Visit Call completed with patient and chart updated.   Pre-Visit Info documented in Specialty Comments under [employer].    

## 2015-05-16 ENCOUNTER — Ambulatory Visit (INDEPENDENT_AMBULATORY_CARE_PROVIDER_SITE_OTHER): Payer: Self-pay | Admitting: Physician Assistant

## 2015-05-16 ENCOUNTER — Encounter: Payer: Self-pay | Admitting: Physician Assistant

## 2015-05-16 VITALS — BP 140/80 | HR 84 | Temp 97.6°F | Ht 66.75 in | Wt 201.6 lb

## 2015-05-16 DIAGNOSIS — E119 Type 2 diabetes mellitus without complications: Secondary | ICD-10-CM

## 2015-05-16 DIAGNOSIS — N529 Male erectile dysfunction, unspecified: Secondary | ICD-10-CM

## 2015-05-16 DIAGNOSIS — I10 Essential (primary) hypertension: Secondary | ICD-10-CM

## 2015-05-16 MED ORDER — TADALAFIL 20 MG PO TABS: 10.0000 mg | ORAL_TABLET | ORAL | Status: DC | PRN

## 2015-05-16 MED ORDER — OLMESARTAN MEDOXOMIL-HCTZ 20-12.5 MG PO TABS: 1.0000 | ORAL_TABLET | Freq: Every day | ORAL | Status: DC

## 2015-05-16 MED ORDER — METFORMIN HCL ER 500 MG PO TB24: 1000.0000 mg | ORAL_TABLET | Freq: Every day | ORAL | Status: DC

## 2015-05-16 NOTE — Progress Notes (Signed)
Pre visit review using our clinic review tool, if applicable. No additional management support is needed unless otherwise documented below in the visit note. 

## 2015-05-16 NOTE — Patient Instructions (Signed)
Please restart medications, taking as directed. Refills have been sent in to your pharmacy.  Please follow-up in early September for a physical.

## 2015-05-16 NOTE — Progress Notes (Signed)
   Patient presents to clinic today to establish care. Patient requesting medication refills.  Chronic Issues: Hypertension -- Currently on Benicar HCT daily. Has not taken medication today. Endorses BP previously well-controlled on this regimen. Patient denies chest pain, palpitations, lightheadedness, dizziness, vision changes or frequent headaches.  Diabetes Mellitus II -- Currently on Metformin 1000 mg ER QD. Denies diarrhea with this medication. Is not checking fasting sugars. Last A1C at 7.0 on 12/02/2014. Denies history of neuropathy, nephropathy or retinopathy. Is followed by Ophthalmology but cannot remember name of specialist.  Erectile Dysfunction -- Well controlled with Cialis 20 mg PRN. Denies hx of BPH.   Past Medical History  Diagnosis Date  . Diabetes mellitus   . Hypertension   . Erectile dysfunction     Past Surgical History  Procedure Laterality Date  . Meniscus tear      pt. reported 10-15 years ago    No current outpatient prescriptions on file prior to visit.   No current facility-administered medications on file prior to visit.    No Known Allergies  Family History  Problem Relation Age of Onset  . Cancer Father   . Cancer Maternal Grandfather   . Cancer Mother     History   Social History  . Marital Status: Married    Spouse Name: N/A  . Number of Children: N/A  . Years of Education: N/A   Occupational History  .  [employer]   Social History Main Topics  . Smoking status: Never Smoker   . Smokeless tobacco: Never Used  . Alcohol Use: No  . Drug Use: No  . Sexual Activity: Not on file   Other Topics Concern  . Not on file   Social History Narrative   Review of Systems  Constitutional: Negative for fever and malaise/fatigue.  Eyes: Negative for blurred vision, double vision and photophobia.  Neurological: Negative for dizziness, loss of consciousness and headaches.   BP 140/80 mmHg  Pulse 84  Temp(Src) 97.6 F (36.4 C) (Oral)  Ht 5'  6.75" (1.695 m)  Wt 201 lb 9.6 oz (91.445 kg)  BMI 31.83 kg/m2  SpO2 98%  Physical Exam  Constitutional: He is oriented to person, place, and time and well-developed, well-nourished, and in no distress.  HENT:  Head: Normocephalic and atraumatic.  Eyes: Conjunctivae are normal.  Neck: Neck supple.  Cardiovascular: Normal rate, regular rhythm, normal heart sounds and intact distal pulses.   Pulmonary/Chest: Effort normal and breath sounds normal. No respiratory distress. He has no wheezes. He has no rales. He exhibits no tenderness.  Neurological: He is alert and oriented to person, place, and time.  Skin: Skin is warm and dry. No rash noted.  Psychiatric: Affect normal.  Vitals reviewed.  Assessment/Plan: Erectile dysfunction Well controlled with Cialis. Will continue same. Patient to schedule a CPE so labs including PSA can be updated.  Essential hypertension BP at 140/80- today in diabetic patient. Patient has not taken medication in 2 days. Discussed importance of taking medications before each visit so that we can accurately assess BP control with medications. Asymptomatic. Patient scheduling CPE for September. Continue medications as directed. Will recheck BP at follow-up.  Diabetes mellitus type II, controlled, with no complications Medications refilled. Will repeat labs at CPE in September. Continue diet and exercise regimen. Continue medications as directed.

## 2015-05-17 DIAGNOSIS — N529 Male erectile dysfunction, unspecified: Secondary | ICD-10-CM | POA: Insufficient documentation

## 2015-05-17 NOTE — Assessment & Plan Note (Signed)
BP at 140/80- today in diabetic patient. Patient has not taken medication in 2 days. Discussed importance of taking medications before each visit so that we can accurately assess BP control with medications. Asymptomatic. Patient scheduling CPE for September. Continue medications as directed. Will recheck BP at follow-up.

## 2015-05-17 NOTE — Assessment & Plan Note (Signed)
Medications refilled. Will repeat labs at CPE in September. Continue diet and exercise regimen. Continue medications as directed.

## 2015-05-17 NOTE — Assessment & Plan Note (Signed)
Well controlled with Cialis. Will continue same. Patient to schedule a CPE so labs including PSA can be updated.

## 2015-05-30 ENCOUNTER — Telehealth: Payer: Self-pay | Admitting: Physician Assistant

## 2015-05-30 NOTE — Telephone Encounter (Signed)
Pre visit letter mailed 05/30/15

## 2015-06-16 ENCOUNTER — Encounter: Payer: Self-pay | Admitting: Behavioral Health

## 2015-06-16 ENCOUNTER — Telehealth: Payer: Self-pay | Admitting: Behavioral Health

## 2015-06-16 NOTE — Telephone Encounter (Signed)
Pre-Visit Call completed with patient and chart updated.   Pre-Visit Info documented in Specialty Comments under [employer].    

## 2015-06-20 ENCOUNTER — Telehealth: Payer: Self-pay | Admitting: Physician Assistant

## 2015-06-20 ENCOUNTER — Ambulatory Visit (INDEPENDENT_AMBULATORY_CARE_PROVIDER_SITE_OTHER): Payer: 59 | Admitting: Physician Assistant

## 2015-06-20 ENCOUNTER — Encounter: Payer: Self-pay | Admitting: Physician Assistant

## 2015-06-20 VITALS — BP 118/82 | HR 78 | Temp 98.2°F | Resp 16 | Ht 66.75 in | Wt 199.5 lb

## 2015-06-20 DIAGNOSIS — E7800 Pure hypercholesterolemia, unspecified: Secondary | ICD-10-CM

## 2015-06-20 DIAGNOSIS — I10 Essential (primary) hypertension: Secondary | ICD-10-CM

## 2015-06-20 DIAGNOSIS — Z125 Encounter for screening for malignant neoplasm of prostate: Secondary | ICD-10-CM

## 2015-06-20 DIAGNOSIS — Z0000 Encounter for general adult medical examination without abnormal findings: Secondary | ICD-10-CM

## 2015-06-20 DIAGNOSIS — E119 Type 2 diabetes mellitus without complications: Secondary | ICD-10-CM

## 2015-06-20 LAB — LIPID PANEL
Cholesterol: 129 mg/dL (ref 0–200)
HDL: 30.3 mg/dL — AB (ref >39.00)
LDL Cholesterol: 81 mg/dL (ref 0–99)
NONHDL: 99.09
TRIGLYCERIDES: 92 mg/dL (ref 0.0–149.0)
Total CHOL/HDL Ratio: 4
VLDL: 18.4 mg/dL (ref 0.0–40.0)

## 2015-06-20 LAB — BASIC METABOLIC PANEL
BUN: 15 mg/dL (ref 6–23)
CHLORIDE: 99 meq/L (ref 96–112)
CO2: 31 meq/L (ref 19–32)
Calcium: 9.6 mg/dL (ref 8.4–10.5)
Creatinine, Ser: 0.95 mg/dL (ref 0.40–1.50)
GFR: 106.52 mL/min (ref >60.00)
Glucose, Bld: 222 mg/dL — ABNORMAL HIGH (ref 70–99)
Potassium: 4 mEq/L (ref 3.5–5.1)
SODIUM: 137 meq/L (ref 135–145)

## 2015-06-20 LAB — URINALYSIS, ROUTINE W REFLEX MICROSCOPIC
BILIRUBIN URINE: NEGATIVE
Ketones, ur: NEGATIVE
LEUKOCYTES UA: NEGATIVE
NITRITE: NEGATIVE
RBC / HPF: NONE SEEN (ref 0–?)
Specific Gravity, Urine: 1.015 (ref 1.000–1.030)
Total Protein, Urine: NEGATIVE
UROBILINOGEN UA: 0.2 (ref 0.0–1.0)
Urine Glucose: NEGATIVE
WBC UA: NONE SEEN (ref 0–?)
pH: 5.5 (ref 5.0–8.0)

## 2015-06-20 LAB — CBC
HCT: 41 % (ref 39.0–52.0)
Hemoglobin: 14.1 g/dL (ref 13.0–17.0)
MCHC: 34.4 g/dL (ref 30.0–36.0)
MCV: 93.5 fl (ref 78.0–100.0)
Platelets: 191 10*3/uL (ref 150.0–400.0)
RBC: 4.38 Mil/uL (ref 4.22–5.81)
RDW: 13.1 % (ref 11.5–15.5)
WBC: 3.6 10*3/uL — AB (ref 4.0–10.5)

## 2015-06-20 LAB — HEPATIC FUNCTION PANEL
ALK PHOS: 94 U/L (ref 39–117)
ALT: 20 U/L (ref 0–53)
AST: 14 U/L (ref 0–37)
Albumin: 4.5 g/dL (ref 3.5–5.2)
BILIRUBIN DIRECT: 0.2 mg/dL (ref 0.0–0.3)
BILIRUBIN TOTAL: 0.9 mg/dL (ref 0.2–1.2)
Total Protein: 7.4 g/dL (ref 6.0–8.3)

## 2015-06-20 LAB — TSH: TSH: 2.13 u[IU]/mL (ref 0.35–4.50)

## 2015-06-20 LAB — HEMOGLOBIN A1C: Hgb A1c MFr Bld: 6.2 % (ref 4.6–6.5)

## 2015-06-20 LAB — PSA: PSA: 1.02 ng/mL (ref 0.10–4.00)

## 2015-06-20 NOTE — Assessment & Plan Note (Signed)
Will check lipid panel. 

## 2015-06-20 NOTE — Assessment & Plan Note (Signed)
Depression screen negative. Health Maintenance reviewed. Declines flu shot. Preventive schedule discussed and handout given in AVS. Will obtain fasting labs today.  

## 2015-06-20 NOTE — Progress Notes (Signed)
Patient presents to clinic today for annual exam.  Patient is fasting for labs.  Acute Concerns: Declines acute concerns today.  Chronic Issues: Hypertension -- Currently on Benicar HCT daily. Has not taken medication today. Endorses BP previously well-controlled on this regimen. Patient denies chest pain, palpitations, lightheadedness, dizziness, vision changes or frequent headaches.  Diabetes Mellitus II -- Currently on Metformin 1000 mg ER QD. Denies diarrhea with this medication. Is not checking fasting sugars. Last A1C at 7.0 on 12/02/2014. Denies history of neuropathy, nephropathy or retinopathy. Is followed by Ophthalmology but cannot remember name of specialist.  Health Maintenance: Dental -- up-to-date Vision -- up-to-dat Immunizations -- Declines flu shot. Others up-to-date/ Colonoscopy -- Due. Father with CRC diagnosed at 56 years old. Agrees to screening.  Past Medical History  Diagnosis Date  . Diabetes mellitus   . Hypertension   . Erectile dysfunction     Past Surgical History  Procedure Laterality Date  . Meniscus tear      pt. reported 10-15 years ago    Current Outpatient Prescriptions on File Prior to Visit  Medication Sig Dispense Refill  . metFORMIN (GLUCOPHAGE-XR) 500 MG 24 hr tablet Take 2 tablets (1,000 mg total) by mouth at bedtime. 180 tablet 1  . olmesartan-hydrochlorothiazide (BENICAR HCT) 20-12.5 MG per tablet Take 1 tablet by mouth daily. 90 tablet 1  . tadalafil (CIALIS) 20 MG tablet Take 0.5-1 tablets (10-20 mg total) by mouth every other day as needed for erectile dysfunction. 5 tablet 11   No current facility-administered medications on file prior to visit.    No Known Allergies  Family History  Problem Relation Age of Onset  . Cancer Father   . Cancer Maternal Grandfather   . Cancer Mother     Social History   Social History  . Marital Status: Married    Spouse Name: N/A  . Number of Children: N/A  . Years of Education: N/A     Occupational History  .  [employer]   Social History Main Topics  . Smoking status: Never Smoker   . Smokeless tobacco: Never Used  . Alcohol Use: No  . Drug Use: No  . Sexual Activity: Not on file   Other Topics Concern  . Not on file   Social History Narrative   Review of Systems  Constitutional: Negative for fever and weight loss.  HENT: Negative for ear discharge, ear pain, hearing loss and tinnitus.   Eyes: Negative for blurred vision, double vision, photophobia and pain.  Respiratory: Negative for cough and shortness of breath.   Cardiovascular: Negative for chest pain and palpitations.  Gastrointestinal: Negative for heartburn, nausea, vomiting, abdominal pain, diarrhea, constipation, blood in stool and melena.  Genitourinary: Negative for dysuria, urgency, frequency, hematuria and flank pain.  Musculoskeletal: Negative for falls.  Neurological: Negative for dizziness, loss of consciousness and headaches.  Endo/Heme/Allergies: Negative for environmental allergies.  Psychiatric/Behavioral: Negative for depression, suicidal ideas, hallucinations and substance abuse. The patient is not nervous/anxious and does not have insomnia.    BP 118/82 mmHg  Pulse 78  Temp(Src) 98.2 F (36.8 C) (Oral)  Resp 16  Ht 5' 6.75" (1.695 m)  Wt 199 lb 8 oz (90.493 kg)  BMI 31.50 kg/m2  SpO2 98%  Physical Exam  Constitutional: He is oriented to person, place, and time and well-developed, well-nourished, and in no distress.  HENT:  Head: Normocephalic and atraumatic.  Right Ear: External ear normal.  Left Ear: External ear normal.  Nose: Nose  normal.  Mouth/Throat: Oropharynx is clear and moist. No oropharyngeal exudate.  Eyes: Conjunctivae and EOM are normal. Pupils are equal, round, and reactive to light.  Neck: Neck supple. No thyromegaly present.  Cardiovascular: Normal rate, regular rhythm, normal heart sounds and intact distal pulses.   Pulmonary/Chest: Effort normal and breath  sounds normal. No respiratory distress. He has no wheezes. He has no rales. He exhibits no tenderness.  Abdominal: Soft. Bowel sounds are normal. He exhibits no distension and no mass. There is no tenderness. There is no rebound and no guarding.  Genitourinary: Testes/scrotum normal and penis normal. No discharge found.  Lymphadenopathy:    He has no cervical adenopathy.  Neurological: He is alert and oriented to person, place, and time.  Skin: Skin is warm and dry. No rash noted.  Psychiatric: Affect normal.  Vitals reviewed.  No results found for this or any previous visit (from the past 2160 hour(s)).  Assessment/Plan: Diabetes mellitus type II, controlled, with no complications Will check BMP and A1C today.  Essential hypertension Well controlled. Continue current regimen. Will check BMP today.  Pure hypercholesterolemia Will check lipid panel.  Visit for preventive health examination Depression screen negative. Health Maintenance reviewed -- Declines flu shot. Preventive schedule discussed and handout given in AVS. Will obtain fasting labs today.

## 2015-06-20 NOTE — Telephone Encounter (Signed)
Error/gd °

## 2015-06-20 NOTE — Patient Instructions (Signed)
Please go to the lab for blood work.  I will call you with your results. If your blood work is normal we will follow-up yearly for physicals.   Please continue medications as directed.  Preventive Care for Adults A healthy lifestyle and preventive care can promote health and wellness. Preventive health guidelines for men include the following key practices:  A routine yearly physical is a good way to check with your health care provider about your health and preventative screening. It is a chance to share any concerns and updates on your health and to receive a thorough exam.  Visit your dentist for a routine exam and preventative care every 6 months. Brush your teeth twice a day and floss once a day. Good oral hygiene prevents tooth decay and gum disease.  The frequency of eye exams is based on your age, health, family medical history, use of contact lenses, and other factors. Follow your health care provider's recommendations for frequency of eye exams.  Eat a healthy diet. Foods such as vegetables, fruits, whole grains, low-fat dairy products, and lean protein foods contain the nutrients you need without too many calories. Decrease your intake of foods high in solid fats, added sugars, and salt. Eat the right amount of calories for you.Get information about a proper diet from your health care provider, if necessary.  Regular physical exercise is one of the most important things you can do for your health. Most adults should get at least 150 minutes of moderate-intensity exercise (any activity that increases your heart rate and causes you to sweat) each week. In addition, most adults need muscle-strengthening exercises on 2 or more days a week.  Maintain a healthy weight. The body mass index (BMI) is a screening tool to identify possible weight problems. It provides an estimate of body fat based on height and weight. Your health care provider can find your BMI and can help you achieve or  maintain a healthy weight.For adults 20 years and older:  A BMI below 18.5 is considered underweight.  A BMI of 18.5 to 24.9 is normal.  A BMI of 25 to 29.9 is considered overweight.  A BMI of 30 and above is considered obese.  Maintain normal blood lipids and cholesterol levels by exercising and minimizing your intake of saturated fat. Eat a balanced diet with plenty of fruit and vegetables. Blood tests for lipids and cholesterol should begin at age 45 and be repeated every 5 years. If your lipid or cholesterol levels are high, you are over 50, or you are at high risk for heart disease, you may need your cholesterol levels checked more frequently.Ongoing high lipid and cholesterol levels should be treated with medicines if diet and exercise are not working.  If you smoke, find out from your health care provider how to quit. If you do not use tobacco, do not start.  Lung cancer screening is recommended for adults aged 91-80 years who are at high risk for developing lung cancer because of a history of smoking. A yearly low-dose CT scan of the lungs is recommended for people who have at least a 30-pack-year history of smoking and are a current smoker or have quit within the past 15 years. A pack year of smoking is smoking an average of 1 pack of cigarettes a day for 1 year (for example: 1 pack a day for 30 years or 2 packs a day for 15 years). Yearly screening should continue until the smoker has stopped smoking for at  least 15 years. Yearly screening should be stopped for people who develop a health problem that would prevent them from having lung cancer treatment.  If you choose to drink alcohol, do not have more than 2 drinks per day. One drink is considered to be 12 ounces (355 mL) of beer, 5 ounces (148 mL) of wine, or 1.5 ounces (44 mL) of liquor.  Avoid use of street drugs. Do not share needles with anyone. Ask for help if you need support or instructions about stopping the use of  drugs.  High blood pressure causes heart disease and increases the risk of stroke. Your blood pressure should be checked at least every 1-2 years. Ongoing high blood pressure should be treated with medicines, if weight loss and exercise are not effective.  If you are 73-14 years old, ask your health care provider if you should take aspirin to prevent heart disease.  Diabetes screening involves taking a blood sample to check your fasting blood sugar level. This should be done once every 3 years, after age 62, if you are within normal weight and without risk factors for diabetes. Testing should be considered at a younger age or be carried out more frequently if you are overweight and have at least 1 risk factor for diabetes.  Colorectal cancer can be detected and often prevented. Most routine colorectal cancer screening begins at the age of 35 and continues through age 78. However, your health care provider may recommend screening at an earlier age if you have risk factors for colon cancer. On a yearly basis, your health care provider may provide home test kits to check for hidden blood in the stool. Use of a small camera at the end of a tube to directly examine the colon (sigmoidoscopy or colonoscopy) can detect the earliest forms of colorectal cancer. Talk to your health care provider about this at age 4, when routine screening begins. Direct exam of the colon should be repeated every 5-10 years through age 59, unless early forms of precancerous polyps or small growths are found.  People who are at an increased risk for hepatitis B should be screened for this virus. You are considered at high risk for hepatitis B if:  You were born in a country where hepatitis B occurs often. Talk with your health care provider about which countries are considered high risk.  Your parents were born in a high-risk country and you have not received a shot to protect against hepatitis B (hepatitis B vaccine).  You have  HIV or AIDS.  You use needles to inject street drugs.  You live with, or have sex with, someone who has hepatitis B.  You are a man who has sex with other men (MSM).  You get hemodialysis treatment.  You take certain medicines for conditions such as cancer, organ transplantation, and autoimmune conditions.  Hepatitis C blood testing is recommended for all people born from [birth year] through 1965 and any individual with known risks for hepatitis C.  Practice safe sex. Use condoms and avoid high-risk sexual practices to reduce the spread of sexually transmitted infections (STIs). STIs include gonorrhea, chlamydia, syphilis, trichomonas, herpes, HPV, and human immunodeficiency virus (HIV). Herpes, HIV, and HPV are viral illnesses that have no cure. They can result in disability, cancer, and death.  If you are at risk of being infected with HIV, it is recommended that you take a prescription medicine daily to prevent HIV infection. This is called preexposure prophylaxis (PrEP). You are considered at  risk if:  You are a man who has sex with other men (MSM) and have other risk factors.  You are a heterosexual man, are sexually active, and are at increased risk for HIV infection.  You take drugs by injection.  You are sexually active with a partner who has HIV.  Talk with your health care provider about whether you are at high risk of being infected with HIV. If you choose to begin PrEP, you should first be tested for HIV. You should then be tested every 3 months for as long as you are taking PrEP.  A one-time screening for abdominal aortic aneurysm (AAA) and surgical repair of large AAAs by ultrasound are recommended for men ages 66 to 66 years who are current or former smokers.  Healthy men should no longer receive prostate-specific antigen (PSA) blood tests as part of routine cancer screening. Talk with your health care provider about prostate cancer screening.  Testicular cancer screening is  not recommended for adult males who have no symptoms. Screening includes self-exam, a health care provider exam, and other screening tests. Consult with your health care provider about any symptoms you have or any concerns you have about testicular cancer.  Use sunscreen. Apply sunscreen liberally and repeatedly throughout the day. You should seek shade when your shadow is shorter than you. Protect yourself by wearing long sleeves, pants, a wide-brimmed hat, and sunglasses year round, whenever you are outdoors.  Once a month, do a whole-body skin exam, using a mirror to look at the skin on your back. Tell your health care provider about new moles, moles that have irregular borders, moles that are larger than a pencil eraser, or moles that have changed in shape or color.  Stay current with required vaccines (immunizations).  Influenza vaccine. All adults should be immunized every year.  Tetanus, diphtheria, and acellular pertussis (Td, Tdap) vaccine. An adult who has not previously received Tdap or who does not know his vaccine status should receive 1 dose of Tdap. This initial dose should be followed by tetanus and diphtheria toxoids (Td) booster doses every 10 years. Adults with an unknown or incomplete history of completing a 3-dose immunization series with Td-containing vaccines should begin or complete a primary immunization series including a Tdap dose. Adults should receive a Td booster every 10 years.  Varicella vaccine. An adult without evidence of immunity to varicella should receive 2 doses or a second dose if he has previously received 1 dose.  Human papillomavirus (HPV) vaccine. Males aged 42-21 years who have not received the vaccine previously should receive the 3-dose series. Males aged 22-26 years may be immunized. Immunization is recommended through the age of 70 years for any male who has sex with males and did not get any or all doses earlier. Immunization is recommended for any  person with an immunocompromised condition through the age of 29 years if he did not get any or all doses earlier. During the 3-dose series, the second dose should be obtained 4-8 weeks after the first dose. The third dose should be obtained 24 weeks after the first dose and 16 weeks after the second dose.  Zoster vaccine. One dose is recommended for adults aged 8 years or older unless certain conditions are present.  Measles, mumps, and rubella (MMR) vaccine. Adults born before [birth year] generally are considered immune to measles and mumps. Adults born in [birth year] or later should have 1 or more doses of MMR vaccine unless there is a contraindication to  the vaccine or there is laboratory evidence of immunity to each of the three diseases. A routine second dose of MMR vaccine should be obtained at least 28 days after the first dose for students attending postsecondary schools, health care workers, or international travelers. People who received inactivated measles vaccine or an unknown type of measles vaccine during 1963-1967 should receive 2 doses of MMR vaccine. People who received inactivated mumps vaccine or an unknown type of mumps vaccine before 1979 and are at high risk for mumps infection should consider immunization with 2 doses of MMR vaccine. Unvaccinated health care workers born before [birth year] who lack laboratory evidence of measles, mumps, or rubella immunity or laboratory confirmation of disease should consider measles and mumps immunization with 2 doses of MMR vaccine or rubella immunization with 1 dose of MMR vaccine.  Pneumococcal 13-valent conjugate (PCV13) vaccine. When indicated, a person who is uncertain of his immunization history and has no record of immunization should receive the PCV13 vaccine. An adult aged 30 years or older who has certain medical conditions and has not been previously immunized should receive 1 dose of PCV13 vaccine. This PCV13 should be followed with a dose of pneumococcal  polysaccharide (PPSV23) vaccine. The PPSV23 vaccine dose should be obtained at least 8 weeks after the dose of PCV13 vaccine. An adult aged 84 years or older who has certain medical conditions and previously received 1 or more doses of PPSV23 vaccine should receive 1 dose of PCV13. The PCV13 vaccine dose should be obtained 1 or more years after the last PPSV23 vaccine dose.  Pneumococcal polysaccharide (PPSV23) vaccine. When PCV13 is also indicated, PCV13 should be obtained first. All adults aged 29 years and older should be immunized. An adult younger than age 75 years who has certain medical conditions should be immunized. Any person who resides in a nursing home or long-term care facility should be immunized. An adult smoker should be immunized. People with an immunocompromised condition and certain other conditions should receive both PCV13 and PPSV23 vaccines. People with human immunodeficiency virus (HIV) infection should be immunized as soon as possible after diagnosis. Immunization during chemotherapy or radiation therapy should be avoided. Routine use of PPSV23 vaccine is not recommended for American Indians, [place] Natives, or people younger than 65 years unless there are medical conditions that require PPSV23 vaccine. When indicated, people who have unknown immunization and have no record of immunization should receive PPSV23 vaccine. One-time revaccination 5 years after the first dose of PPSV23 is recommended for people aged 19-64 years who have chronic kidney failure, nephrotic syndrome, asplenia, or immunocompromised conditions. People who received 1-2 doses of PPSV23 before age 4 years should receive another dose of PPSV23 vaccine at age 22 years or later if at least 5 years have passed since the previous dose. Doses of PPSV23 are not needed for people immunized with PPSV23 at or after age 22 years.  Meningococcal vaccine. Adults with asplenia or persistent complement component deficiencies  should receive 2 doses of quadrivalent meningococcal conjugate (MenACWY-D) vaccine. The doses should be obtained at least 2 months apart. Microbiologists working with certain meningococcal bacteria, [place] recruits, people at risk during an outbreak, and people who travel to or live in countries with a high rate of meningitis should be immunized. A first-year college student up through age 34 years who is living in a residence hall should receive a dose if he did not receive a dose on or after his 16th birthday. Adults who have certain high-risk conditions  should receive one or more doses of vaccine.  Hepatitis A vaccine. Adults who wish to be protected from this disease, have certain high-risk conditions, work with hepatitis A-infected animals, work in hepatitis A research labs, or travel to or work in countries with a high rate of hepatitis A should be immunized. Adults who were previously unvaccinated and who anticipate close contact with an international adoptee during the first 60 days after arrival in the [place] from a country with a high rate of hepatitis A should be immunized.  Hepatitis B vaccine. Adults should be immunized if they wish to be protected from this disease, have certain high-risk conditions, may be exposed to blood or other infectious body fluids, are household contacts or sex partners of hepatitis B positive people, are clients or workers in certain care facilities, or travel to or work in countries with a high rate of hepatitis B.  Haemophilus influenzae type b (Hib) vaccine. A previously unvaccinated person with asplenia or sickle cell disease or having a scheduled splenectomy should receive 1 dose of Hib vaccine. Regardless of previous immunization, a recipient of a hematopoietic stem cell transplant should receive a 3-dose series 6-12 months after his successful transplant. Hib vaccine is not recommended for adults with HIV infection. Preventive Service / Frequency Ages  42 to 61  Blood pressure check.** / Every 1 to 2 years.  Lipid and cholesterol check.** / Every 5 years beginning at age 26.  Hepatitis C blood test.** / For any individual with known risks for hepatitis C.  Skin self-exam. / Monthly.  Influenza vaccine. / Every year.  Tetanus, diphtheria, and acellular pertussis (Tdap, Td) vaccine.** / Consult your health care provider. 1 dose of Td every 10 years.  Varicella vaccine.** / Consult your health care provider.  HPV vaccine. / 3 doses over 6 months, if 41 or younger.  Measles, mumps, rubella (MMR) vaccine.** / You need at least 1 dose of MMR if you were born in 1957 or later. You may also need a second dose.  Pneumococcal 13-valent conjugate (PCV13) vaccine.** / Consult your health care provider.  Pneumococcal polysaccharide (PPSV23) vaccine.** / 1 to 2 doses if you smoke cigarettes or if you have certain conditions.  Meningococcal vaccine.** / 1 dose if you are age 79 to 61 years and a Market researcher living in a residence hall, or have one of several medical conditions. You may also need additional booster doses.  Hepatitis A vaccine.** / Consult your health care provider.  Hepatitis B vaccine.** / Consult your health care provider.  Haemophilus influenzae type b (Hib) vaccine.** / Consult your health care provider. Ages 72 to 41  Blood pressure check.** / Every 1 to 2 years.  Lipid and cholesterol check.** / Every 5 years beginning at age 32.  Lung cancer screening. / Every year if you are aged 44-80 years and have a 30-pack-year history of smoking and currently smoke or have quit within the past 15 years. Yearly screening is stopped once you have quit smoking for at least 15 years or develop a health problem that would prevent you from having lung cancer treatment.  Fecal occult blood test (FOBT) of stool. / Every year beginning at age 27 and continuing until age 58. You may not have to do this test if you get a  colonoscopy every 10 years.  Flexible sigmoidoscopy** or colonoscopy.** / Every 5 years for a flexible sigmoidoscopy or every 10 years for a colonoscopy beginning at age 48 and  continuing until age 30.  Hepatitis C blood test.** / For all people born from [birth year] through 1965 and any individual with known risks for hepatitis C.  Skin self-exam. / Monthly.  Influenza vaccine. / Every year.  Tetanus, diphtheria, and acellular pertussis (Tdap/Td) vaccine.** / Consult your health care provider. 1 dose of Td every 10 years.  Varicella vaccine.** / Consult your health care provider.  Zoster vaccine.** / 1 dose for adults aged 57 years or older.  Measles, mumps, rubella (MMR) vaccine.** / You need at least 1 dose of MMR if you were born in 1956 or later. You may also need a second dose.  Pneumococcal 13-valent conjugate (PCV13) vaccine.** / Consult your health care provider.  Pneumococcal polysaccharide (PPSV23) vaccine.** / 1 to 2 doses if you smoke cigarettes or if you have certain conditions.  Meningococcal vaccine.** / Consult your health care provider.  Hepatitis A vaccine.** / Consult your health care provider.  Hepatitis B vaccine.** / Consult your health care provider.  Haemophilus influenzae type b (Hib) vaccine.** / Consult your health care provider. Ages 41 and over  Blood pressure check.** / Every 1 to 2 years.  Lipid and cholesterol check.**/ Every 5 years beginning at age 59.  Lung cancer screening. / Every year if you are aged 21-80 years and have a 30-pack-year history of smoking and currently smoke or have quit within the past 15 years. Yearly screening is stopped once you have quit smoking for at least 15 years or develop a health problem that would prevent you from having lung cancer treatment.  Fecal occult blood test (FOBT) of stool. / Every year beginning at age 80 and continuing until age 73. You may not have to do this test if you get a colonoscopy every 10  years.  Flexible sigmoidoscopy** or colonoscopy.** / Every 5 years for a flexible sigmoidoscopy or every 10 years for a colonoscopy beginning at age 53 and continuing until age 80.  Hepatitis C blood test.** / For all people born from [birth year] through 1964 and any individual with known risks for hepatitis C.  Abdominal aortic aneurysm (AAA) screening.** / A one-time screening for ages 49 to 46 years who are current or former smokers.  Skin self-exam. / Monthly.  Influenza vaccine. / Every year.  Tetanus, diphtheria, and acellular pertussis (Tdap/Td) vaccine.** / 1 dose of Td every 10 years.  Varicella vaccine.** / Consult your health care provider.  Zoster vaccine.** / 1 dose for adults aged 5 years or older.  Pneumococcal 13-valent conjugate (PCV13) vaccine.** / Consult your health care provider.  Pneumococcal polysaccharide (PPSV23) vaccine.** / 1 dose for all adults aged 38 years and older.  Meningococcal vaccine.** / Consult your health care provider.  Hepatitis A vaccine.** / Consult your health care provider.  Hepatitis B vaccine.** / Consult your health care provider.  Haemophilus influenzae type b (Hib) vaccine.** / Consult your health care provider. **Family history and personal history of risk and conditions may change your health care provider's recommendations. Document Released: 11/20/2001 Document Revised: 09/29/2013 Document Reviewed: 02/19/2011 [facility] Patient Information 2015 [place], [place]. This information is not intended to replace advice given to you by your health care provider. Make sure you discuss any questions you have with your health care provider.

## 2015-06-20 NOTE — Assessment & Plan Note (Signed)
Will check BMP and A1C today.

## 2015-06-20 NOTE — Progress Notes (Signed)
Pre visit review using our clinic review tool, if applicable. No additional management support is needed unless otherwise documented below in the visit note/SLS  

## 2015-06-20 NOTE — Assessment & Plan Note (Signed)
Well controlled.  Continue current regimen.  Will check BMP today. 

## 2015-08-19 ENCOUNTER — Telehealth: Payer: Self-pay | Admitting: Physician Assistant

## 2015-08-19 NOTE — Telephone Encounter (Signed)
Should it be a 15 min or 30 min?

## 2015-08-19 NOTE — Telephone Encounter (Signed)
Received disability determination forms from the [place] regarding knee injury while in service. I cannot fill these out unless he comes in for an appointment to discuss in detail and examination of the knee. He needs appointment.

## 2015-08-19 NOTE — Telephone Encounter (Signed)
30 if you can since there a several pages that have to be gone through

## 2015-08-22 NOTE — Telephone Encounter (Signed)
Please call pt and schedule 30 minute appt with [facility] for completion of forms from the [place] concerning his knee. Thanks, [email or web address]//[email or web address]

## 2015-08-22 NOTE — Telephone Encounter (Signed)
Scheduled for 08/23/15 7:00am.

## 2015-08-23 ENCOUNTER — Ambulatory Visit (INDEPENDENT_AMBULATORY_CARE_PROVIDER_SITE_OTHER): Payer: 59 | Admitting: Physician Assistant

## 2015-08-23 ENCOUNTER — Telehealth: Payer: Self-pay | Admitting: Physician Assistant

## 2015-08-23 ENCOUNTER — Encounter: Payer: Self-pay | Admitting: Physician Assistant

## 2015-08-23 ENCOUNTER — Telehealth: Payer: Self-pay | Admitting: *Deleted

## 2015-08-23 VITALS — BP 140/90 | HR 81 | Temp 97.8°F | Resp 16 | Ht 66.75 in | Wt 204.0 lb

## 2015-08-23 DIAGNOSIS — M25562 Pain in left knee: Secondary | ICD-10-CM | POA: Diagnosis not present

## 2015-08-23 DIAGNOSIS — Z1211 Encounter for screening for malignant neoplasm of colon: Secondary | ICD-10-CM | POA: Diagnosis not present

## 2015-08-23 DIAGNOSIS — G8929 Other chronic pain: Secondary | ICD-10-CM

## 2015-08-23 MED ORDER — TADALAFIL 20 MG PO TABS: 10.0000 mg | ORAL_TABLET | ORAL | Status: DC | PRN

## 2015-08-23 NOTE — Progress Notes (Signed)
Pre visit review using our clinic review tool, if applicable. No additional management support is needed unless otherwise documented below in the visit note/SLS  

## 2015-08-23 NOTE — Telephone Encounter (Signed)
LMOM with contact name and number for Birder Robson at [facility] return call, if needed] RE: pt medical records request [fax [phone] to complete patient [employer] Disability forms per provider instructions/SLS

## 2015-08-23 NOTE — Telephone Encounter (Signed)
Pt dropped off documents to be seen by provider, if any question please call pt at [phone].

## 2015-08-23 NOTE — Progress Notes (Signed)
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   Patient also requesting new referral to GI for screening colonoscopy.  Past Medical History  Diagnosis Date  . Diabetes mellitus   . Hypertension   . Erectile dysfunction     Current Outpatient Prescriptions on File Prior to Visit  Medication Sig Dispense Refill  . metFORMIN (GLUCOPHAGE-XR) 500 MG 24 hr tablet Take 2 tablets (1,000 mg total) by mouth at bedtime. 180 tablet 1  . olmesartan-hydrochlorothiazide (BENICAR HCT) 20-12.5 MG per tablet Take 1 tablet by mouth daily. 90 tablet 1   No current facility-administered medications on file prior to visit.    No Known Allergies  Family History  Problem Relation Age of Onset  . Cancer Father   . Cancer Maternal Grandfather   . Cancer Mother     Social History   Social History  . Marital Status: Married    Spouse Name: N/A  . Number of Children: N/A  . Years of Education: N/A   Occupational History  .  [employer]   Social History Main Topics  . Smoking status: Never Smoker   . Smokeless tobacco: Never Used  . Alcohol Use: No  . Drug Use: No  . Sexual Activity: Not Asked   Other Topics Concern  . None   Social History Narrative    Review of Systems - See HPI.  All other ROS are negative.  BP 140/90 mmHg  Pulse 81  Temp(Src) 97.8 F (36.6 C) (Oral)  Resp 16  Ht 5' 6.75" (1.695 m)  Wt 204 lb (92.534 kg)  BMI 32.21 kg/m2  SpO2 98%  Physical Exam  Constitutional: He is oriented to person, place, and time and well-developed, well-nourished, and in no distress.  HENT:  Head: Normocephalic and atraumatic.  Pulmonary/Chest: Effort normal and breath sounds normal. No respiratory distress. He has no wheezes. He has no rales. He exhibits no tenderness.  Musculoskeletal:       Right knee:  Normal.       Left knee: He exhibits decreased range of motion. He exhibits no swelling, no effusion, normal alignment, no LCL laxity, normal patellar mobility, normal meniscus and no MCL laxity. No tenderness found.  Neurological: He is alert and oriented to person, place, and time.  Skin: Skin is warm and dry. No rash noted.  Psychiatric: Affect normal.  Vitals reviewed.   Recent Results (from the past 2160 hour(s))  CBC     Status: Abnormal   Collection Time: 06/20/15  8:14 AM  Result Value Ref Range   WBC 3.6 (L) 4.0 - 10.5 K/uL   RBC 4.38 4.22 - 5.81 Mil/uL   Platelets 191.0 150.0 - 400.0 K/uL   Hemoglobin 14.1 13.0 - 17.0 g/dL   HCT 41.0 39.0 - 52.0 %   MCV 93.5 78.0 - 100.0 fl   MCHC 34.4 30.0 - 36.0 g/dL   RDW 13.1 11.5 - [id number] %  Basic Metabolic Panel (BMET)     Status: Abnormal   Collection Time: 06/20/15  8:14 AM  Result Value Ref Range   Sodium 137 135 - 145 mEq/L   Potassium 4.0 3.5 - 5.1 mEq/L   Chloride 99 96 - 112 mEq/L   CO2 31 19 - 32 mEq/L   Glucose, Bld 222 (H) 70 - 99 mg/dL  BUN 15 6 - 23 mg/dL   Creatinine, Ser 0.95 0.40 - 1.50 mg/dL   Calcium 9.6 8.4 - 10.5 mg/dL   GFR 106.52 >60.00 mL/min  Hepatic function panel     Status: None   Collection Time: 06/20/15  8:14 AM  Result Value Ref Range   Total Bilirubin 0.9 0.2 - 1.2 mg/dL   Bilirubin, Direct 0.2 0.0 - 0.3 mg/dL   Alkaline Phosphatase 94 39 - 117 U/L   AST 14 0 - 37 U/L   ALT 20 0 - 53 U/L   Total Protein 7.4 6.0 - 8.3 g/dL   Albumin 4.5 3.5 - 5.2 g/dL  TSH     Status: None   Collection Time: 06/20/15  8:14 AM  Result Value Ref Range   TSH 2.13 0.35 - 4.50 uIU/mL  Hemoglobin A1c     Status: None   Collection Time: 06/20/15  8:14 AM  Result Value Ref Range   Hgb A1c MFr Bld 6.2 4.6 - 6.5 %    Comment: Glycemic Control Guidelines for People with Diabetes:Non Diabetic:  <6%Goal of Therapy: <7%Additional Action Suggested:  >8%   Urinalysis, Routine w reflex microscopic (not at [facility])      Status: Abnormal   Collection Time: 06/20/15  8:14 AM  Result Value Ref Range   Color, Urine YELLOW Yellow;Lt. Yellow   APPearance CLEAR Clear   Specific Gravity, Urine 1.015 1.000-1.030   pH 5.5 5.0 - 8.0   Total Protein, Urine NEGATIVE Negative   Urine Glucose NEGATIVE Negative   Ketones, ur NEGATIVE Negative   Bilirubin Urine NEGATIVE Negative   Hgb urine dipstick TRACE-LYSED (A) Negative   Urobilinogen, UA 0.2 0.0 - 1.0   Leukocytes, UA NEGATIVE Negative   Nitrite NEGATIVE Negative   WBC, UA none seen 0-2/hpf   RBC / HPF none seen 0-2/hpf  Lipid panel     Status: Abnormal   Collection Time: 06/20/15  8:14 AM  Result Value Ref Range   Cholesterol 129 0 - 200 mg/dL    Comment: ATP III Classification       Desirable:  < 200 mg/dL               Borderline High:  200 - 239 mg/dL          High:  > = 240 mg/dL   Triglycerides 92.0 0.0 - 149.0 mg/dL    Comment: Normal:  <150 mg/dLBorderline High:  150 - 199 mg/dL   HDL 30.30 (L) >39.00 mg/dL   VLDL 18.4 0.0 - 40.0 mg/dL   LDL Cholesterol 81 0 - 99 mg/dL   Total CHOL/HDL Ratio 4     Comment:                Men          Women1/2 Average Risk     3.4          3.3Average Risk          5.0          4.42X Average Risk          9.6          7.13X Average Risk          15.0          11.0                       NonHDL 99.09     Comment: NOTE:  Non-HDL goal  should be 30 mg/dL higher than patient's LDL goal (i.e. LDL goal of < 70 mg/dL, would have non-HDL goal of < 100 mg/dL)  PSA     Status: None   Collection Time: 06/20/15  8:14 AM  Result Value Ref Range   PSA 1.02 0.10 - 4.00 ng/mL    Assessment/Plan: Colon cancer screening Referral to GI placed.  Chronic knee pain Significant limitation in flexion of left knee with significant pain. Extension well preserved. No instability noted. Forms mostly completed at visit. Will obtain records from Ortho to help in completion.

## 2015-08-23 NOTE — Telephone Encounter (Signed)
Paperwork received.

## 2015-08-24 NOTE — Telephone Encounter (Signed)
Recd call from [place] to check on status of disability and forms. Informed that provider has them.

## 2015-08-28 DIAGNOSIS — G8929 Other chronic pain: Secondary | ICD-10-CM | POA: Insufficient documentation

## 2015-08-28 DIAGNOSIS — M25569 Pain in unspecified knee: Secondary | ICD-10-CM

## 2015-08-28 DIAGNOSIS — Z1211 Encounter for screening for malignant neoplasm of colon: Secondary | ICD-10-CM | POA: Insufficient documentation

## 2015-08-28 NOTE — Assessment & Plan Note (Signed)
Referral to GI placed

## 2015-08-28 NOTE — Assessment & Plan Note (Signed)
Significant limitation in flexion of left knee with significant pain. Extension well preserved. No instability noted. Forms mostly completed at visit. Will obtain records from Ortho to help in completion.

## 2015-08-31 NOTE — Telephone Encounter (Signed)
Spoke with Brent Cook and gave information to office again of records needed for new patient RE: Disability for VA forms, LMOM with contact name and number for return call from Brent Cook in Medical Records to be faxed to Clinic Fax at [phone]

## 2015-08-31 NOTE — Telephone Encounter (Signed)
Will you attempt again to get medical records so I can complete paperwork.

## 2015-09-06 ENCOUNTER — Telehealth: Payer: Self-pay | Admitting: Physician Assistant

## 2015-09-06 NOTE — Telephone Encounter (Signed)
Caller name: Brent Cook  Relationship to patient: Brent Cook  Can be reached: [phone]   Reason for call: Calling about Vet Disability forms that were faxed approximately 2 weeks ago.

## 2015-09-06 NOTE — Telephone Encounter (Signed)
Please contact patient to inform him of the hold up.

## 2015-09-06 NOTE — Telephone Encounter (Signed)
Never received 

## 2015-09-07 NOTE — Telephone Encounter (Signed)
Called Brent Cook back to inform her that forms were never received. States she will refax.

## 2015-09-08 NOTE — Telephone Encounter (Signed)
Forms that were received. Brent Cook states these forms cannot be filled out by him. We did receive these about two weeks ago and I faxed them back stating they need to be filled out by Ortho. I re-faxed [employer] at [phone] and informed them of this. Forms were shredded. JG//CMA

## 2015-09-19 NOTE — Telephone Encounter (Signed)
Can you please attempt to reach patient again regarding the need for records and the hold up with [facility] in giving [place] records

## 2015-09-21 NOTE — Telephone Encounter (Signed)
Noted. Will attempt to fill out once records received. I am out of office between [place] and [place].

## 2015-09-21 NOTE — Telephone Encounter (Signed)
FYI: Patient called back and stated that he went to [facility] and he signed Medical Release form and was told it would take 5-7 days before they could get the physical records. He states "he may have some of the older records filed at his home"; pt was informed that he must submit copies of the Original Office Visit Notes and Records on the facilities letterhead; he then stated he would probably "have to wait the 5-7 days for the records to come in " from [facility]/SLS

## 2015-09-21 NOTE — Telephone Encounter (Signed)
Patient informed, understood & agreed and states to let provider know that he is on his way now to [facility] to request records and he will forward to our office ASAP/SLS

## 2015-09-23 ENCOUNTER — Encounter: Payer: Self-pay | Admitting: Internal Medicine

## 2015-10-07 ENCOUNTER — Telehealth: Payer: Self-pay | Admitting: *Deleted

## 2015-10-07 NOTE — Telephone Encounter (Signed)
Received Medical Records from [facility], forwarded to provider/SLS 12/30

## 2015-10-07 NOTE — Telephone Encounter (Signed)
Records received via mail from [place]. Forwarded to [place]. JG//CMA

## 2015-10-13 ENCOUNTER — Telehealth: Payer: Self-pay | Admitting: Physician Assistant

## 2015-10-13 NOTE — Telephone Encounter (Signed)
Finally received and reviewed all of the records from [facility] dated back from 2002 regarding chronic left knee pain. 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 However on reviewing all of his records from Orthopedics, the [place] hospital and records while in the [place], I do not see evidence where these issues stem from his documented knee contusion in 1987. There is even a full report from the [place] regarding assessment for disability back in 2010 where it clearly mentions the contusion sustained in service with follow-up appointments documenting resolution of symptoms. The other issues stemmed from work at [employer] in a meniscal tear from heavy lifting. There is no way I can ethically say his current pain is a result of a knee contusion back in 1987 and therefore I cannot submit papers to the [place] stating it was.  I do not think he will be able to get anyone to connect the two to where the [place] will see his chronic pain as a service-related incident. I do however think it is a work-related incident while employed at [employer]. His previous orthopedic surgeon (Dr. Tonita Cong) who treated these issues may be able to help with a case regardi as it was the main reason they had to treat him for so long, and the main cause of his knee impairment.  I am sorry I cannot help further but with the documentation given. He is welcome to his records from [place] in case he wants to start a work-related disability claim.

## 2015-10-14 NOTE — Telephone Encounter (Signed)
Pt called to follow up on below. Pt said claim is in litigation and he was supposed to be able to pick up information from our office today. Advised him that someone will call him asap with information regarding his case but it may be Monday or Tuesday (weather pending). Best ph # [phone].

## 2015-10-17 ENCOUNTER — Ambulatory Visit (INDEPENDENT_AMBULATORY_CARE_PROVIDER_SITE_OTHER): Payer: 59 | Admitting: Physician Assistant

## 2015-10-17 ENCOUNTER — Encounter: Payer: Self-pay | Admitting: Physician Assistant

## 2015-10-17 ENCOUNTER — Other Ambulatory Visit: Payer: Self-pay | Admitting: Physician Assistant

## 2015-10-17 VITALS — BP 140/90 | HR 84 | Temp 97.8°F | Ht 66.75 in | Wt 211.4 lb

## 2015-10-17 DIAGNOSIS — Z0271 Encounter for disability determination: Secondary | ICD-10-CM | POA: Diagnosis not present

## 2015-10-17 DIAGNOSIS — M25562 Pain in left knee: Principal | ICD-10-CM

## 2015-10-17 DIAGNOSIS — G8929 Other chronic pain: Secondary | ICD-10-CM

## 2015-10-17 NOTE — Telephone Encounter (Signed)
Patient did not agree with assessment an request appointment to come in and speak with provider; scheduled today at 2:30p, provider aware/SLS

## 2015-10-17 NOTE — Progress Notes (Signed)
Pre visit review using our clinic review tool, if applicable. No additional management support is needed unless otherwise documented below in the visit note. 

## 2015-10-17 NOTE — Progress Notes (Signed)
   Patient here for discussion regarding disability paperwork. Patient is attempting to get disability through the [place] for an injury sustained in 1987. All paperwork had been received from [facility], [place] and [employer]. Unable to connect chronic symptoms with knee contusion sustained in 1987 as there is a gap in medical records from 1987 to 2002 when patient was seen by Ortho for a work-related injury. Patient has had two denials for service-connected disability (1987 -- denial letter for review only) and 2010 (examination and denial both available for reviewed).   Past Medical History  Diagnosis Date  . Diabetes mellitus   . Hypertension   . Erectile dysfunction     Current Outpatient Prescriptions on File Prior to Visit  Medication Sig Dispense Refill  . metFORMIN (GLUCOPHAGE-XR) 500 MG 24 hr tablet Take 2 tablets (1,000 mg total) by mouth at bedtime. 180 tablet 1  . olmesartan-hydrochlorothiazide (BENICAR HCT) 20-12.5 MG per tablet Take 1 tablet by mouth daily. 90 tablet 1  . tadalafil (CIALIS) 20 MG tablet Take 0.5-1 tablets (10-20 mg total) by mouth every other day as needed for erectile dysfunction. 30 tablet 0   No current facility-administered medications on file prior to visit.    No Known Allergies  Family History  Problem Relation Age of Onset  . Cancer Father   . Cancer Maternal Grandfather   . Cancer Mother     Social History   Social History  . Marital Status: Married    Spouse Name: N/A  . Number of Children: N/A  . Years of Education: N/A   Occupational History  .  Itg   Social History Main Topics  . Smoking status: Never Smoker   . Smokeless tobacco: Never Used  . Alcohol Use: No  . Drug Use: No  . Sexual Activity: Not Asked   Other Topics Concern  . None   Social History Narrative   Review of Systems - See HPI.  All other ROS are negative.  BP 140/90 mmHg  Pulse 84  Temp(Src) 97.8 F (36.6 C) (Oral)  Ht 5' 6.75" (1.695 m)  Wt 211 lb 6.4 oz (95.89  kg)  BMI 33.38 kg/m2  SpO2 98%  Physical Exam  Constitutional: He is oriented to person, place, and time and well-developed, well-nourished, and in no distress.  HENT:  Head: Normocephalic and atraumatic.  Eyes: Conjunctivae are normal.  Pulmonary/Chest: Effort normal.  Neurological: He is alert and oriented to person, place, and time.  Skin: Skin is warm and dry. No rash noted.  Psychiatric: Affect normal.  Vitals reviewed.   No results found for this or any previous visit (from the past 2160 hour(s)).  Assessment/Plan: Disability Determination -- After lengthy discussion, will not be able to connect the two ethically. Referral placed to his prior orthopedist for further determination.

## 2015-11-18 ENCOUNTER — Ambulatory Visit (AMBULATORY_SURGERY_CENTER): Payer: Self-pay | Admitting: *Deleted

## 2015-11-18 VITALS — Ht 67.0 in | Wt 205.8 lb

## 2015-11-18 DIAGNOSIS — Z800 Family history of malignant neoplasm of digestive organs: Secondary | ICD-10-CM

## 2015-11-18 MED ORDER — NA SULFATE-K SULFATE-MG SULF 17.5-3.13-1.6 GM/177ML PO SOLN: 1.0000 | Freq: Once | ORAL | Status: DC

## 2015-11-18 NOTE — Progress Notes (Signed)
No egg or soy allergy known to patient  No issues with past sedation with any surgeries  or procedures, no intubation problems  No diet pills per patient No home 02 use per patient  No blood thinners per patient  emmi declined

## 2015-12-02 ENCOUNTER — Ambulatory Visit (AMBULATORY_SURGERY_CENTER): Payer: 59 | Admitting: Internal Medicine

## 2015-12-02 ENCOUNTER — Encounter: Payer: Self-pay | Admitting: Internal Medicine

## 2015-12-02 VITALS — BP 133/82 | HR 72 | Temp 98.0°F | Resp 12 | Ht 67.0 in | Wt 205.0 lb

## 2015-12-02 DIAGNOSIS — Z800 Family history of malignant neoplasm of digestive organs: Secondary | ICD-10-CM

## 2015-12-02 DIAGNOSIS — Z1211 Encounter for screening for malignant neoplasm of colon: Secondary | ICD-10-CM | POA: Diagnosis not present

## 2015-12-02 MED ORDER — SODIUM CHLORIDE 0.9 % IV SOLN: 500.0000 mL | INTRAVENOUS | Status: DC

## 2015-12-02 NOTE — Patient Instructions (Signed)
YOU HAD AN ENDOSCOPIC PROCEDURE TODAY AT THE [facility]:   Refer to the procedure report that was given to you for any specific questions about what was found during the examination.  If the procedure report does not answer your questions, please call your gastroenterologist to clarify.  If you requested that your care partner not be given the details of your procedure findings, then the procedure report has been included in a sealed envelope for you to review at your convenience later.  YOU SHOULD EXPECT: Some feelings of bloating in the abdomen. Passage of more gas than usual.  Walking can help get rid of the air that was put into your GI tract during the procedure and reduce the bloating. If you had a lower endoscopy (such as a colonoscopy or flexible sigmoidoscopy) you may notice spotting of blood in your stool or on the toilet paper. If you underwent a bowel prep for your procedure, you may not have a normal bowel movement for a few days.  Please Note:  You might notice some irritation and congestion in your nose or some drainage.  This is from the oxygen used during your procedure.  There is no need for concern and it should clear up in a day or so.  SYMPTOMS TO REPORT IMMEDIATELY:   Following lower endoscopy (colonoscopy or flexible sigmoidoscopy):  Excessive amounts of blood in the stool  Significant tenderness or worsening of abdominal pains  Swelling of the abdomen that is new, acute  Fever of 100F or higher  For urgent or emergent issues, a gastroenterologist can be reached at any hour by calling [phone].  DIET: Your first meal following the procedure should be a small meal and then it is ok to progress to your normal diet. Heavy or fried foods are harder to digest and may make you feel nauseous or bloated.  Likewise, meals heavy in dairy and vegetables can increase bloating.  Drink plenty of fluids but you should avoid alcoholic beverages for 24 hours.  ACTIVITY:   You should plan to take it easy for the rest of today and you should NOT DRIVE or use heavy machinery until tomorrow (because of the sedation medicines used during the test).    FOLLOW UP: Our staff will call the number listed on your records the next business day following your procedure to check on you and address any questions or concerns that you may have regarding the information given to you following your procedure. If we do not reach you, we will leave a message.  However, if you are feeling well and you are not experiencing any problems, there is no need to return our call.  We will assume that you have returned to your regular daily activities without incident.   SIGNATURES/CONFIDENTIALITY: You and/or your care partner have signed paperwork which will be entered into your electronic medical record.  These signatures attest to the fact that that the information above on your After Visit Summary has been reviewed and is understood.  Full responsibility of the confidentiality of this discharge information lies with you and/or your care-partner.  Continue your normal medications  Next colonoscopy- 5 years 

## 2015-12-02 NOTE — Progress Notes (Signed)
Report to PACU, RN, vss, BBS= Clear.  

## 2015-12-02 NOTE — Op Note (Signed)
[facility] [place]  [address] [place], [id number]   COLONOSCOPY PROCEDURE REPORT  PATIENT: Brent Cook, Brent Cook  MR#: [id number] BIRTHDATE: 06/19/1961 , 54  yrs. old GENDER: male ENDOSCOPIST: Eustace Quail, MD REFERRED [id number] [address] [place], [place]. PROCEDURE DATE:  12/02/2015 PROCEDURE:   Colonoscopy, screening First Screening Colonoscopy - Avg.  risk and is 50 yrs.  old or older Yes.  Prior Negative Screening - Now for repeat screening. N/A  History of Adenoma - Now for follow-up colonoscopy & has been > or = to 3 yrs.  N/A  Polyps removed today? No Polyps removed today? No Recommend repeat exam, <10 yrs? Yes high risk ASA CLASS:   Class II INDICATIONS:Screening for colonic neoplasia and FH Colon or Rectal Adenocarcinoma. Dad 60-70s MEDICATIONS: Propofol 300 mg IV and Monitored anesthesia care  DESCRIPTION OF PROCEDURE:   After the risks benefits and alternatives of the procedure were thoroughly explained, informed consent was obtained.  The digital rectal exam revealed no abnormalities of the rectum.   The LB [id number]  endoscope was introduced through the anus and advanced to the cecum, which was identified by both the appendix and ileocecal valve. No adverse events experienced.   The quality of the prep was good.  (Suprep was used)  The instrument was then slowly withdrawn as the colon was fully examined. Estimated blood loss is zero unless otherwise noted in this procedure report.      COLON FINDINGS: A normal appearing cecum, ileocecal valve, and appendiceal orifice were identified.  The ascending, transverse, descending, sigmoid colon, and rectum appeared unremarkable. Retroflexed views revealed internal hemorrhoids. The time to cecum = 4.7 Withdrawal time = 16.0   The scope was withdrawn and the procedure completed. COMPLICATIONS: There were no immediate complications.  ENDOSCOPIC IMPRESSION: 1. Normal colonoscopy  RECOMMENDATIONS: 1. Follow up  colonoscopy in 5 years  (Family hx)  eSigned:  Eustace Quail, MD 12/02/2015 8:51 AM   cc: The Patient and Brunetta Jeans, [place]

## 2015-12-05 ENCOUNTER — Telehealth: Payer: Self-pay

## 2015-12-05 NOTE — Telephone Encounter (Signed)
  Follow up Call-  Call back number 12/02/2015  Post procedure Call Back phone  # [phone]  Permission to leave phone message Yes     Patient was called after his procedure on 12/02/2015. No answer at the number given for follow up phone call. A message was left on his answering machine.

## 2015-12-07 ENCOUNTER — Other Ambulatory Visit: Payer: Self-pay | Admitting: Physician Assistant

## 2016-01-01 ENCOUNTER — Other Ambulatory Visit: Payer: Self-pay | Admitting: Physician Assistant

## 2016-03-16 ENCOUNTER — Other Ambulatory Visit: Payer: Self-pay | Admitting: Physician Assistant

## 2016-03-16 NOTE — Telephone Encounter (Signed)
Refill for Metformin sent to pharmacy.

## 2016-03-24 ENCOUNTER — Other Ambulatory Visit: Payer: Self-pay | Admitting: Physician Assistant

## 2016-04-26 ENCOUNTER — Other Ambulatory Visit: Payer: Self-pay | Admitting: Physician Assistant

## 2016-04-27 ENCOUNTER — Other Ambulatory Visit: Payer: Self-pay | Admitting: Physician Assistant

## 2016-06-15 ENCOUNTER — Other Ambulatory Visit: Payer: Self-pay | Admitting: Physician Assistant

## 2016-06-15 NOTE — Telephone Encounter (Signed)
Please schedule patient for an annual exam. Thank you----Pc

## 2016-06-27 ENCOUNTER — Other Ambulatory Visit: Payer: Self-pay | Admitting: Physician Assistant

## 2016-06-27 NOTE — Telephone Encounter (Signed)
Rx request to pharmacy/SLS  

## 2016-08-14 ENCOUNTER — Other Ambulatory Visit: Payer: Self-pay | Admitting: Physician Assistant

## 2016-09-26 ENCOUNTER — Ambulatory Visit: Payer: Self-pay | Admitting: Orthopedic Surgery

## 2016-10-10 ENCOUNTER — Encounter (HOSPITAL_COMMUNITY): Payer: Self-pay | Admitting: *Deleted

## 2016-10-15 DIAGNOSIS — Z0000 Encounter for general adult medical examination without abnormal findings: Secondary | ICD-10-CM | POA: Diagnosis not present

## 2016-10-21 ENCOUNTER — Telehealth: Payer: Self-pay | Admitting: Physician Assistant

## 2016-10-22 ENCOUNTER — Other Ambulatory Visit: Payer: Self-pay | Admitting: Physician Assistant

## 2016-10-23 ENCOUNTER — Telehealth: Payer: Self-pay | Admitting: Physician Assistant

## 2016-10-23 NOTE — Telephone Encounter (Signed)
Patient calling to check status of refill request of Cialis.

## 2016-10-23 NOTE — Telephone Encounter (Signed)
Received refill requests from patient. Patient is well overdue for follow-up for diabetes and blood pressure. He needs to schedule a follow-up with [place] ASAP. I did note a prescription for atorvastatin (lipitor) that has been filled by the [place]. Is he seeing them as his primary care?

## 2016-10-23 NOTE — Telephone Encounter (Signed)
LMOVM advising patient if he is getting medications from [place] or is needing to make an appointment for refills from our office

## 2016-10-23 NOTE — Telephone Encounter (Signed)
Advised patient he would need an appointment for refills. He is agreeable.

## 2016-10-23 NOTE — Telephone Encounter (Signed)
Spoke with patient and advised he would need an appointment to check blood pressure and diabetes. He is agreeable. Scheduled for 10/24/16 at 10:15

## 2016-10-24 ENCOUNTER — Ambulatory Visit: Payer: 59 | Admitting: Physician Assistant

## 2016-10-26 ENCOUNTER — Encounter: Payer: Self-pay | Admitting: Physician Assistant

## 2016-10-26 ENCOUNTER — Ambulatory Visit (INDEPENDENT_AMBULATORY_CARE_PROVIDER_SITE_OTHER): Payer: 59 | Admitting: Physician Assistant

## 2016-10-26 VITALS — BP 130/90 | HR 94 | Temp 97.7°F | Resp 16 | Ht 67.0 in | Wt 207.0 lb

## 2016-10-26 DIAGNOSIS — E119 Type 2 diabetes mellitus without complications: Secondary | ICD-10-CM | POA: Diagnosis not present

## 2016-10-26 DIAGNOSIS — I10 Essential (primary) hypertension: Secondary | ICD-10-CM

## 2016-10-26 DIAGNOSIS — N529 Male erectile dysfunction, unspecified: Secondary | ICD-10-CM | POA: Diagnosis not present

## 2016-10-26 MED ORDER — CIALIS 20 MG PO TABS: 20.0000 mg | ORAL_TABLET | Freq: Every day | ORAL | 0 refills | Status: DC | PRN

## 2016-10-26 NOTE — Progress Notes (Signed)
Patient presents to clinic today for follow-up of hypertension, hyperlipidemia, diabetes mellitus II and Erectile Dysfunction. Patient is overdue for follow-up. Last follow-up visit 06/2015. Patient has been seen by the [facility] since that time.   Hypertension -- Is currently on losartan. Endorses taking as directed. Patient denies chest pain, palpitations, lightheadedness, dizziness, vision changes or frequent headaches. Has not taken medication today.   BP Readings from Last 3 Encounters:  11/10/16 (!) 147/82  11/02/16 (!) 146/82  10/26/16 130/90    Hyperlipidemia -- Patient is currently on Lipitor 40 mg daily. Endorses taking as directed without side effect.   DM II -- [facility]. Endorses last check was about 2 weeks ago. Metformin XR 1000 mg daily. Endorses tolerating well. Fasting sugars running about 115-120. Endorses last eye exam a year ago. Is followed at [facility]. Exam normal per patient. Denies history of neuropathy or nephropathy. Denies numbness or tingling of feet.   Erectile Dysfunction -- Currently on Cialis 20 mg as needed. Endorses good outcome with this medication without side effect.   Osteoarthritis of L Knee -- injury in the [place]. Is scheduled with Dr. Tonita Cong 11/08/2016 for TKR.   Past Medical History:  Diagnosis Date  . Arthritis    left knee  . Asthma    as child  . Diabetes mellitus    type 2  . Erectile dysfunction   . Hypertension     Current Outpatient Prescriptions on File Prior to Visit  Medication Sig Dispense Refill  . hydrochlorothiazide (HYDRODIURIL) 25 MG tablet Take 37.5 mg by mouth at bedtime.    Marland Kitchen losartan (COZAAR) 100 MG tablet Take 50 mg by mouth at bedtime.    . metFORMIN (GLUCOPHAGE-XR) 500 MG 24 hr tablet Take 1,000 mg by mouth at bedtime.    Marland Kitchen aspirin EC 325 MG tablet Take 1 tablet (325 mg total) by mouth 2 (two) times daily after a meal. 60 tablet 1  . celecoxib (CELEBREX) 200  MG capsule Take 1 capsule (200 mg total) by mouth every 12 (twelve) hours. 60 capsule 0  . HYDROmorphone (DILAUDID) 2 MG tablet Take 1-2 tablets (2-4 mg total) by mouth every 4 (four) hours as needed for severe pain. 60 tablet 0  . methocarbamol (ROBAXIN) 500 MG tablet Take 1 tablet (500 mg total) by mouth every 6 (six) hours as needed for muscle spasms. 40 tablet 1  . polyethylene glycol (MIRALAX / GLYCOLAX) packet Take 17 g by mouth daily. 14 each 0   No current facility-administered medications on file prior to visit.     No Known Allergies  Family History  Problem Relation Age of Onset  . Cancer Father   . Colon cancer Father     dx'd late 62's early 102's  . Cancer Mother   . Cancer Maternal Grandfather   . Colon polyps Neg Hx   . Rectal cancer Neg Hx   . Stomach cancer Neg Hx     Social History   Social History  . Marital status: Married    Spouse name: N/A  . Number of children: N/A  . Years of education: N/A   Occupational History  .  [employer]   Social History Main Topics  . Smoking status: Never Smoker  . Smokeless tobacco: Never Used  . Alcohol use No  . Drug use: No  . Sexual activity: Not Asked   Other Topics Concern  . None   Social History  Narrative  . None    Review of Systems - See HPI.  All other ROS are negative.  BP 130/90   Pulse 94   Temp 97.7 F (36.5 C) (Oral)   Resp 16   Ht 5' 7"  (1.702 m)   Wt 207 lb (93.9 kg)   SpO2 96%   BMI 32.42 kg/m   Physical Exam  Constitutional: He is oriented to person, place, and time and well-developed, well-nourished, and in no distress.  HENT:  Head: Normocephalic and atraumatic.  Cardiovascular: Normal rate, regular rhythm, normal heart sounds and intact distal pulses.   Pulmonary/Chest: Effort normal and breath sounds normal. No respiratory distress. He has no wheezes. He has no rales. He exhibits no tenderness.  Neurological: He is alert and oriented to person, place, and time.  Skin: Skin is warm  and dry. No rash noted.  Psychiatric: Affect normal.  Vitals reviewed.   Recent Results (from the past 2160 hour(s))  Glucose, capillary     Status: Abnormal   Collection Time: 11/02/16  8:09 AM  Result Value Ref Range   Glucose-Capillary 108 (H) 65 - 99 mg/dL  Surgical pcr screen     Status: None   Collection Time: 11/02/16  8:10 AM  Result Value Ref Range   MRSA, PCR NEGATIVE NEGATIVE   Staphylococcus aureus NEGATIVE NEGATIVE    Comment:        The Xpert SA Assay (FDA approved for NASAL specimens in patients over 40 years of age), is one component of a comprehensive surveillance program.  Test performance has been validated by [facility] for patients greater than or equal to 55 year old. It is not intended to diagnose infection nor to guide or monitor treatment.   Hemoglobin A1c     Status: Abnormal   Collection Time: 11/02/16  8:56 AM  Result Value Ref Range   Hgb A1c MFr Bld 6.1 (H) 4.8 - 5.6 %    Comment: (NOTE)         Pre-diabetes: 5.7 - 6.4         Diabetes: >6.4         Glycemic control for adults with diabetes: <7.0    Mean Plasma Glucose 128 mg/dL    Comment: (NOTE) Performed At: [facility] [place], [place] [id number] Lindon Romp MD TZ:[id number]   APTT     Status: None   Collection Time: 11/02/16  8:56 AM  Result Value Ref Range   aPTT 32 24 - 36 seconds  Basic metabolic panel     Status: Abnormal   Collection Time: 11/02/16  8:56 AM  Result Value Ref Range   Sodium 138 135 - 145 mmol/L   Potassium 3.5 3.5 - 5.1 mmol/L   Chloride 102 101 - 111 mmol/L   CO2 30 22 - 32 mmol/L   Glucose, Bld 118 (H) 65 - 99 mg/dL   BUN 16 6 - 20 mg/dL   Creatinine, Ser 0.97 0.61 - 1.24 mg/dL   Calcium 9.4 8.9 - 10.3 mg/dL   GFR calc non Af Amer >60 >60 mL/min   GFR calc Af Amer >60 >60 mL/min    Comment: (NOTE) The eGFR has been calculated using the CKD EPI equation. This calculation has not been validated in all clinical  situations. eGFR's persistently <60 mL/min signify possible Chronic Kidney Disease.    Anion gap 6 5 - 15  CBC     Status: Abnormal   Collection Time: 11/02/16  8:56 AM  Result Value Ref Range   WBC 3.9 (L) 4.0 - 10.5 K/uL   RBC 4.19 (L) 4.22 - 5.81 MIL/uL   Hemoglobin 13.4 13.0 - 17.0 g/dL   HCT 37.0 (L) 39.0 - 52.0 %   MCV 88.3 78.0 - 100.0 fL   MCH 32.0 26.0 - 34.0 pg   MCHC 36.2 (H) 30.0 - 36.0 g/dL   RDW 13.3 11.5 - 15.5 %   Platelets 196 150 - 400 K/uL  Protime-INR     Status: None   Collection Time: 11/02/16  8:56 AM  Result Value Ref Range   Prothrombin Time 13.7 11.4 - 15.2 seconds   INR 1.04   Type and screen Order type and screen if day of surgery is less than 15 days from draw of preadmission visit or order morning of surgery if day of surgery is greater than 6 days from preadmission visit.     Status: None   Collection Time: 11/02/16  8:56 AM  Result Value Ref Range   ABO/RH(D) O NEG    Antibody Screen NEG    Sample Expiration 11/11/2016    Extend sample reason NO TRANSFUSIONS OR PREGNANCY IN THE PAST 3 MONTHS   Urinalysis, Routine w reflex microscopic     Status: Abnormal   Collection Time: 11/02/16  8:56 AM  Result Value Ref Range   Color, Urine YELLOW YELLOW   APPearance CLEAR CLEAR   Specific Gravity, Urine 1.008 1.005 - 1.030   pH 6.0 5.0 - 8.0   Glucose, UA NEGATIVE NEGATIVE mg/dL   Hgb urine dipstick SMALL (A) NEGATIVE   Bilirubin Urine NEGATIVE NEGATIVE   Ketones, ur NEGATIVE NEGATIVE mg/dL   Protein, ur NEGATIVE NEGATIVE mg/dL   Nitrite NEGATIVE NEGATIVE   Leukocytes, UA NEGATIVE NEGATIVE   RBC / HPF 0-5 0 - 5 RBC/hpf   WBC, UA 0-5 0 - 5 WBC/hpf   Bacteria, UA NONE SEEN NONE SEEN   Squamous Epithelial / LPF NONE SEEN NONE SEEN  ABO/Rh     Status: None   Collection Time: 11/02/16  8:56 AM  Result Value Ref Range   ABO/RH(D) O NEG   Glucose, capillary     Status: Abnormal   Collection Time: 11/08/16  5:21 AM  Result Value Ref Range    Glucose-Capillary 142 (H) 65 - 99 mg/dL  Glucose, capillary     Status: Abnormal   Collection Time: 11/08/16 10:04 AM  Result Value Ref Range   Glucose-Capillary 165 (H) 65 - 99 mg/dL  Glucose, capillary     Status: Abnormal   Collection Time: 11/08/16 12:46 PM  Result Value Ref Range   Glucose-Capillary 274 (H) 65 - 99 mg/dL  Glucose, capillary     Status: Abnormal   Collection Time: 11/08/16  4:41 PM  Result Value Ref Range   Glucose-Capillary 324 (H) 65 - 99 mg/dL  Glucose, capillary     Status: Abnormal   Collection Time: 11/08/16  9:46 PM  Result Value Ref Range   Glucose-Capillary 218 (H) 65 - 99 mg/dL  CBC     Status: Abnormal   Collection Time: 11/09/16  4:23 AM  Result Value Ref Range   WBC 10.4 4.0 - 10.5 K/uL   RBC 3.77 (L) 4.22 - 5.81 MIL/uL   Hemoglobin 11.7 (L) 13.0 - 17.0 g/dL   HCT 32.2 (L) 39.0 - 52.0 %   MCV 85.4 78.0 - 100.0 fL   MCH 31.0 26.0 - 34.0 pg   MCHC  36.3 (H) 30.0 - 36.0 g/dL   RDW 12.9 11.5 - 15.5 %   Platelets 173 150 - 400 K/uL  Basic metabolic panel     Status: Abnormal   Collection Time: 11/09/16  4:23 AM  Result Value Ref Range   Sodium 136 135 - 145 mmol/L   Potassium 3.5 3.5 - 5.1 mmol/L   Chloride 102 101 - 111 mmol/L   CO2 28 22 - 32 mmol/L   Glucose, Bld 237 (H) 65 - 99 mg/dL   BUN 17 6 - 20 mg/dL   Creatinine, Ser 0.94 0.61 - 1.24 mg/dL   Calcium 8.5 (L) 8.9 - 10.3 mg/dL   GFR calc non Af Amer >60 >60 mL/min   GFR calc Af Amer >60 >60 mL/min    Comment: (NOTE) The eGFR has been calculated using the CKD EPI equation. This calculation has not been validated in all clinical situations. eGFR's persistently <60 mL/min signify possible Chronic Kidney Disease.    Anion gap 6 5 - 15  Glucose, capillary     Status: Abnormal   Collection Time: 11/09/16  7:20 AM  Result Value Ref Range   Glucose-Capillary 242 (H) 65 - 99 mg/dL  Glucose, capillary     Status: Abnormal   Collection Time: 11/09/16 11:53 AM  Result Value Ref Range    Glucose-Capillary 234 (H) 65 - 99 mg/dL  Glucose, capillary     Status: Abnormal   Collection Time: 11/09/16  4:06 PM  Result Value Ref Range   Glucose-Capillary 206 (H) 65 - 99 mg/dL  Glucose, capillary     Status: Abnormal   Collection Time: 11/09/16  9:03 PM  Result Value Ref Range   Glucose-Capillary 168 (H) 65 - 99 mg/dL  CBC     Status: Abnormal   Collection Time: 11/10/16  5:40 AM  Result Value Ref Range   WBC 8.6 4.0 - 10.5 K/uL   RBC 3.51 (L) 4.22 - 5.81 MIL/uL   Hemoglobin 11.3 (L) 13.0 - 17.0 g/dL   HCT 30.8 (L) 39.0 - 52.0 %   MCV 87.7 78.0 - 100.0 fL   MCH 32.2 26.0 - 34.0 pg   MCHC 36.7 (H) 30.0 - 36.0 g/dL   RDW 13.0 11.5 - 15.5 %   Platelets 151 150 - 400 K/uL  Glucose, capillary     Status: Abnormal   Collection Time: 11/10/16  7:31 AM  Result Value Ref Range   Glucose-Capillary 226 (H) 65 - 99 mg/dL  Glucose, capillary     Status: Abnormal   Collection Time: 11/10/16 12:28 PM  Result Value Ref Range   Glucose-Capillary 264 (H) 65 - 99 mg/dL    Assessment/Plan: Essential hypertension BP above goal today. Patient has not taken medications. Asymptomatic. Patient has been managed by [facility] clinic in [place]. Discussed that he would need to choose his primary care -- the provider who will be managing chronic issues. Patient elects to have [place] continue to manage chronic conditions but will continue with [place] for acute concerns. As such medication refills of chronic medications will not come from this office. Discussed compliance with medications and to [facility] for [facility]  Diabetes mellitus type II, controlled, with no complications Managed by [facility]. Patient to continue care per there instructions. Will need to get refills from them. Medication list updated. Patient to bring copy of records from [place] to abstract into his chart, especially recent blood work.  Erectile dysfunction Cialis refilled. Further  refills to come from  [place].    Leeanne Rio, PA-C

## 2016-10-26 NOTE — Patient Instructions (Signed)
Please contact the [facility] to get recent lab results from them. Continue chronic medication management per them. For [place] to take over any chronic medications we will need to be obtaining monitoring labs or at least receiving copies of results from the [place].  I will refill the Cialis. If you are wanting to keep all care at the [facility] from now on you need to request refills from them.

## 2016-10-26 NOTE — Progress Notes (Signed)
Pre visit review using our clinic review tool, if applicable. No additional management support is needed unless otherwise documented below in the visit note. 

## 2016-10-31 ENCOUNTER — Other Ambulatory Visit (HOSPITAL_COMMUNITY): Payer: Self-pay | Admitting: *Deleted

## 2016-10-31 NOTE — Patient Instructions (Addendum)
Brent Cook  10/31/2016   Your procedure is scheduled on: 11-08-16  Report to [facility] Main  Entrance take [facility]  elevators to 3rd floor to  [place] at 515  AM.  Call this number if you have problems the morning of surgery [phone]   Remember: ONLY 1 PERSON MAY GO WITH YOU TO SHORT STAY TO GET  READY MORNING OF [place].  Do not eat food or drink liquids :After Midnight.     Take these medicines the morning of surgery with A SIP OF WATER: NONE DO NOT TAKE ANY DIABETIC MEDICATIONS DAY OF YOUR SURGERY                               You may not have any metal on your body including hair pins and              piercings  Do not wear jewelry, make-up, lotions, powders or perfumes, deodorant             Do not wear nail polish.  Do not shave  48 hours prior to surgery.              Men may shave face and neck.   Do not bring valuables to the hospital. [place].  Contacts, dentures or bridgework may not be worn into surgery.  Leave suitcase in the car. After surgery it may be brought to your room.                 Please read over the following fact sheets you were given: _____________________________________________________________________             How to Manage Your Diabetes Before and After Surgery  Why is it important to control my blood sugar before and after surgery? . Improving blood sugar levels before and after surgery helps healing and can limit problems. . A way of improving blood sugar control is eating a healthy diet by: o  Eating less sugar and carbohydrates o  Increasing activity/exercise o  Talking with your doctor about reaching your blood sugar goals . High blood sugars (greater than 180 mg/dL) can raise your risk of infections and slow your recovery, so you will need to focus on controlling your diabetes during the weeks before surgery. . Make sure that  the doctor who takes care of your diabetes knows about your planned surgery including the date and location.  How do I manage my blood sugar before surgery? . Check your blood sugar at least 4 times a day, starting 2 days before surgery, to make sure that the level is not too high or low. o Check your blood sugar the morning of your surgery when you wake up and every 2 hours until you get to the Short Stay unit. . If your blood sugar is less than 70 mg/dL, you will need to treat for low blood sugar: o Do not take insulin. o Treat a low blood sugar (less than 70 mg/dL) with  cup of clear juice (cranberry or apple), 4 glucose tablets, OR glucose gel. o Recheck blood sugar in 15 minutes after treatment (to  make sure it is greater than 70 mg/dL). If your blood sugar is not greater than 70 mg/dL on recheck, call [phone] for further instructions. . Report your blood sugar to the short stay nurse when you get to Short Stay.  . If you are admitted to the hospital after surgery: o Your blood sugar will be checked by the staff and you will probably be given insulin after surgery (instead of oral diabetes medicines) to make sure you have good blood sugar levels. o The goal for blood sugar control after surgery is 80-180 mg/dL.   WHAT DO I DO ABOUT MY DIABETES MEDICATION?  YOU MAY TAKE YOUR METFORMIN AS USUAL THE DAY BEFORE SURGERY ON 11-07-16 . Do not take oral diabetes medicines (pills) the morning of surgery.        Patient Signature:  Date:   Nurse Signature:  Date:   Reviewed and Endorsed by [facility] Patient Education Committee, August 2015  [facility] - Preparing for Surgery Before surgery, you can play an important role.  Because skin is not sterile, your skin needs to be as free of germs as possible.  You can reduce the number of germs on your skin by washing with CHG (chlorahexidine gluconate) soap before surgery.  CHG is an antiseptic cleaner which kills germs and bonds with  the skin to continue killing germs even after washing. Please DO NOT use if you have an allergy to CHG or antibacterial soaps.  If your skin becomes reddened/irritated stop using the CHG and inform your nurse when you arrive at Short Stay. Do not shave (including legs and underarms) for at least 48 hours prior to the first CHG shower.  You may shave your face/neck. Please follow these instructions carefully:  1.  Shower with CHG Soap the night before surgery and the  morning of Surgery.  2.  If you choose to wash your hair, wash your hair first as usual with your  normal  shampoo.  3.  After you shampoo, rinse your hair and body thoroughly to remove the  shampoo.                           4.  Use CHG as you would any other liquid soap.  You can apply chg directly  to the skin and wash                       Gently with a scrungie or clean washcloth.  5.  Apply the CHG Soap to your body ONLY FROM THE NECK DOWN.   Do not use on face/ open                           Wound or open sores. Avoid contact with eyes, ears mouth and genitals (private parts).                       Wash face,  Genitals (private parts) with your normal soap.             6.  Wash thoroughly, paying special attention to the area where your surgery  will be performed.  7.  Thoroughly rinse your body with warm water from the neck down.  8.  DO NOT shower/wash with your normal soap after using and rinsing off  the CHG Soap.  9.  Pat yourself dry with a clean towel.            10.  Wear clean pajamas.            11.  Place clean sheets on your bed the night of your first shower and do not  sleep with pets. Day of Surgery : Do not apply any lotions/deodorants the morning of surgery.  Please wear clean clothes to the hospital/surgery center.  Incentive Spirometer  An incentive spirometer is a tool that can help keep your lungs clear and active. This tool measures how well you are filling your lungs with each breath.  Taking long deep breaths may help reverse or decrease the chance of developing breathing (pulmonary) problems (especially infection) following:  A long period of time when you are unable to move or be active. BEFORE THE PROCEDURE   If the spirometer includes an indicator to show your best effort, your nurse or respiratory therapist will set it to a desired goal.  If possible, sit up straight or lean slightly forward. Try not to slouch.  Hold the incentive spirometer in an upright position. INSTRUCTIONS FOR USE  1. Sit on the edge of your bed if possible, or sit up as far as you can in bed or on a chair. 2. Hold the incentive spirometer in an upright position. 3. Breathe out normally. 4. Place the mouthpiece in your mouth and seal your lips tightly around it. 5. Breathe in slowly and as deeply as possible, raising the piston or the ball toward the top of the column. 6. Hold your breath for 3-5 seconds or for as long as possible. Allow the piston or ball to fall to the bottom of the column. 7. Remove the mouthpiece from your mouth and breathe out normally. 8. Rest for a few seconds and repeat Steps 1 through 7 at least 10 times every 1-2 hours when you are awake. Take your time and take a few normal breaths between deep breaths. 9. The spirometer may include an indicator to show your best effort. Use the indicator as a goal to work toward during each repetition. 10. After each set of 10 deep breaths, practice coughing to be sure your lungs are clear. If you have an incision (the cut made at the time of surgery), support your incision when coughing by placing a pillow or rolled up towels firmly against it. Once you are able to get out of bed, walk around indoors and cough well. You may stop using the incentive spirometer when instructed by your caregiver.  RISKS AND COMPLICATIONS  Take your time so you do not get dizzy or light-headed.  If you are in pain, you may need to take or ask for pain  medication before doing incentive spirometry. It is harder to take a deep breath if you are having pain. AFTER USE  Rest and breathe slowly and easily.  It can be helpful to keep track of a log of your progress. Your caregiver can provide you with a simple table to help with this. If you are using the spirometer at home, follow these instructions: [place] IF:   You are having difficultly using the spirometer.  You have trouble using the spirometer as often as instructed.  Your pain medication is not giving enough relief while using the spirometer.  You develop fever of 100.5 F (38.1 C) or higher. SEEK IMMEDIATE MEDICAL CARE IF:   You cough up bloody sputum that  had not been present before.  You develop fever of 102 F (38.9 C) or greater.  You develop worsening pain at or near the incision site. MAKE SURE YOU:   Understand these instructions.  Will watch your condition.  Will get help right away if you are not doing well or get worse. Document Released: 02/04/2007 Document Revised: 12/17/2011 Document Reviewed: 04/07/2007 ExitCare Patient Information 2014 ExitCare, [place].   ________________________________________________________________________  WHAT IS A BLOOD TRANSFUSION? Blood Transfusion Information  A transfusion is the replacement of blood or some of its parts. Blood is made up of multiple cells which provide different functions.  Red blood cells carry oxygen and are used for blood loss replacement.  White blood cells fight against infection.  Platelets control bleeding.  Plasma helps clot blood.  Other blood products are available for specialized needs, such as hemophilia or other clotting disorders. BEFORE THE TRANSFUSION  Who gives blood for transfusions?   Healthy volunteers who are fully evaluated to make sure their blood is safe. This is blood bank blood. Transfusion therapy is the safest it has ever been in the practice of medicine.  Before blood is taken from a donor, a complete history is taken to make sure that person has no history of diseases nor engages in risky social behavior (examples are intravenous drug use or sexual activity with multiple partners). The donor's travel history is screened to minimize risk of transmitting infections, such as malaria. The donated blood is tested for signs of infectious diseases, such as HIV and hepatitis. The blood is then tested to be sure it is compatible with you in order to minimize the chance of a transfusion reaction. If you or a relative donates blood, this is often done in anticipation of surgery and is not appropriate for emergency situations. It takes many days to process the donated blood. RISKS AND COMPLICATIONS Although transfusion therapy is very safe and saves many lives, the main dangers of transfusion include:   Getting an infectious disease.  Developing a transfusion reaction. This is an allergic reaction to something in the blood you were given. Every precaution is taken to prevent this. The decision to have a blood transfusion has been considered carefully by your caregiver before blood is given. Blood is not given unless the benefits outweigh the risks. AFTER THE TRANSFUSION  Right after receiving a blood transfusion, you will usually feel much better and more energetic. This is especially true if your red blood cells have gotten low (anemic). The transfusion raises the level of the red blood cells which carry oxygen, and this usually causes an energy increase.  The nurse administering the transfusion will monitor you carefully for complications. HOME CARE INSTRUCTIONS  No special instructions are needed after a transfusion. You may find your energy is better. Speak with your caregiver about any limitations on activity for underlying diseases you may have. SEEK MEDICAL CARE IF:   Your condition is not improving after your transfusion.  You develop redness or  irritation at the intravenous (IV) site. SEEK IMMEDIATE MEDICAL CARE IF:  Any of the following symptoms occur over the next 12 hours:  Shaking chills.  You have a temperature by mouth above 102 F (38.9 C), not controlled by medicine.  Chest, back, or muscle pain.  People around you feel you are not acting correctly or are confused.  Shortness of breath or difficulty breathing.  Dizziness and fainting.  You get a rash or develop hives.  You have a decrease in  urine output.  Your urine turns a dark color or changes to pink, red, or brown. Any of the following symptoms occur over the next 10 days:  You have a temperature by mouth above 102 F (38.9 C), not controlled by medicine.  Shortness of breath.  Weakness after normal activity.  The white part of the eye turns yellow (jaundice).  You have a decrease in the amount of urine or are urinating less often.  Your urine turns a dark color or changes to pink, red, or brown. Document Released: 09/21/2000 Document Revised: 12/17/2011 Document Reviewed: 05/10/2008 [facility] Patient Information 2014 [place], [place].  _______________________________________________________________________

## 2016-11-01 ENCOUNTER — Ambulatory Visit: Payer: Self-pay | Admitting: Orthopedic Surgery

## 2016-11-01 NOTE — H&P (Signed)
Brent Cook DOB: 1961-01-07 Married / Language: English / Race: White Male  H&P Date: 11/01/16  Chief Complaint: Left knee pain  History of Present Illness  The patient is a 56 year old male who comes in today for a preoperative History and Physical. The patient is scheduled for a left total knee arthroplasty to be performed by Dr. Johnn Hai, MD at [facility] on 11/08/2016. 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 He last saw Brent Cook approximately 10 years ago, had a knee scope in 2007, had grade 3 changes of the medial compartment and patellofemoral at that point, no grade 4 changes; however, states Brent Cook had told him that next time he was back, he would likely need a knee replacement. He reports progressively worsening symptoms. Knowing that he needed a knee replacement, he has not come back for a followup until now that this has gotten to the point where he feels he is ready to proceed with that. Reports that the knee sometimes jams on him meaning walking giving way and feels unstable. He is always conscious of it, does not trust it, difficulty with steps, very stiff after first getting up in the morning and after prolonged seating, and it does swell on him. He reports weightbearing pain. He is taking ibuprofen as needed, which does seem to help some. He has previously had injection therapy, both steroid and viscosupplementation, both of which did not give him any long-term relief. He denies history of DVT or MRSA. No known drug allergies. He is diabetic, on metformin only, no insulin. His last A1c was in the 6's.  Brent Cook and the patient mutually agreed to proceed with a total knee replacement. Risks and benefits of the procedure were discussed including stiffness, suboptimal range of motion, persistent pain, infection requiring removal of prosthesis and reinsertion, need for prophylactic antibiotics in the future,  for example, dental procedures, possible need for manipulation, revision in the future and also anesthetic complications including DVT, PE, etc. We discussed the perioperative course, time in the hospital, postoperative recovery and the need for elevation to control swelling. We also discussed the predicted range of motion and the probability that squatting and kneeling would be unobtainable in the future. In addition, postoperative anticoagulation was discussed. We have obtained preoperative medical clearance as necessary. Provided illustrated handout and discussed it in detail. They will enroll in the total joint replacement educational forum at the hospital.  His [facility] pre-op appt is tomorrow.  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 Diabetes Mellitus  Mother. Heart Disease  Mother. Hypertension  Mother. First Degree Relatives   Social History  Tobacco use  Never smoker. 09/20/2016 Current work status  working full time Exercise  Exercises daily; does gym / [employer] Former drinker  09/20/2016: In the past drank Living situation  live with spouse, 2-3 steps to enter, one level set-up post-op Marital status  married No history of drug/alcohol rehab  Not under pain contract  Tobacco / smoke exposure  09/20/2016: no Post-Surgical Plans  home with HHPT  Medication History MetFORMIN HCl ER (500MG  Tablet ER 24HR, Oral) Active. Losartan Potassium (100MG  Tablet, Oral) Active. Cialis (Oral) Specific strength unknown - Active. Ibuprofen (Oral) Specific strength unknown -  Active. (prn) Medications Reconciled  Past Surgical History Arthroscopy of Knee  left  Review of Systems General Not Present- Chills, Fatigue, Fever, Memory Loss, Night Sweats, Weight Gain and Weight  Loss. Skin Not Present- Eczema, Hives, Itching, Lesions and Rash. HEENT Not Present- Dentures, Double Vision, Headache, Hearing Loss, Tinnitus and Visual Loss. Respiratory Not Present- Allergies, Chronic Cough, Coughing up blood, Shortness of breath at rest and Shortness of breath with exertion. Cardiovascular Not Present- Chest Pain, Difficulty Breathing Lying Down, Murmur, Palpitations, Racing/skipping heartbeats and Swelling. Gastrointestinal Not Present- Abdominal Pain, Bloody Stool, Constipation, Diarrhea, Difficulty Swallowing, Heartburn, Jaundice, Loss of appetitie, Nausea and Vomiting. Male Genitourinary Not Present- Blood in Urine, Discharge, Flank Pain, Incontinence, Painful Urination, Urgency, Urinary frequency, Urinary Retention, Urinating at Night and Weak urinary stream. Musculoskeletal Present- Joint Pain, Joint Swelling and Morning Stiffness. Not Present- Back Pain, Muscle Pain, Muscle Weakness and Spasms. Neurological Not Present- Blackout spells, Difficulty with balance, Dizziness, Paralysis, Tremor and Weakness. Psychiatric Not Present- Insomnia.  Vitals 11/01/2016 1:21 PM Weight: 206 lb Height: 67in Body Surface Area: 2.05 m Body Mass Index: 32.26 kg/m  BP: 140/84 (Sitting, Left Arm, Standard)  Physical Exam General Mental Status -Alert, cooperative and good historian. General Appearance-pleasant, Not in acute distress. Orientation-Oriented X3. Build & Nutrition-Well nourished and Well developed.  Head and Neck Head-normocephalic, atraumatic . Neck Global Assessment - supple, no bruit auscultated on the right, no bruit auscultated on the left.  Eye Pupil - Bilateral-Regular and Round. Motion - Bilateral-EOMI.  Chest and Lung Exam Auscultation Breath sounds - clear at anterior chest wall and clear at posterior chest wall. Adventitious sounds - No Adventitious sounds.  Cardiovascular Auscultation Rhythm - Regular rate and rhythm. Heart  Sounds - S1 WNL and S2 WNL. Murmurs & Other Heart Sounds - Auscultation of the heart reveals - No Murmurs.  Abdomen Palpation/Percussion Tenderness - Abdomen is non-tender to palpation. Rigidity (guarding) - Abdomen is soft. Auscultation Auscultation of the abdomen reveals - Bowel sounds normal.  Male Genitourinary Not done, not pertinent to present illness  Musculoskeletal On exam, he walks with an antalgic gait. Mood and affect is appropriate. He is tender in the medial joint line and has patellofemoral pain with compression. Trace effusion, 120 to -3. No DVT. Ipsilateral hip and ankle exam is unremarkable.  Imaging X-rays ordered, obtained, reviewed today by Brent Cook. No fracture, subluxation, dislocation, lytic or blastic lesions. There is significant patellofemoral DJD, spurring in the patellofemoral compartment and the lateral compartment. Patella does appear to be tracking relatively midline. He does have some narrowing of that lateral compartment; however, there is not a full collapse. He does not have a varus or valgus deformity. The medial and lateral joint lines are relatively well preserved on today's x-ray.  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 We again discussed the procedure itself as well as risks, complications and alternatives, including but not limited to DVT, PE, infx, bleeding, failure of procedure, need for secondary procedure including manipulation, nerve injury, ongoing pain/symptoms, anesthesia risk, even stroke or death. Also discussed typical post-op protocols, activity restrictions, need for PT, flexion/extension exercises, time out of work. Discussed need for DVT  ppx post-op per protocol. Discussed dental ppx and infx prevention. Also discussed limitations post-operatively such as kneeling and  squatting. All questions were answered. Patient desires to proceed with surgery as scheduled. Will hold supplements, ASA and NSAIDs accordingly. Will remain NPO after MN night before surgery. Will present to [facility] for pre-op testing. Anticipate hospital stay to include at least 2 midnights given medical history and to ensure proper pain control. Plan ASA 325mg  BID for DVT ppx post-op. Plan pain medication, Robaxin, Colace, Miralax. Plan home with HHPT post-op with family members at home for assistance. Will follow up 10-14 days post-op for staple removal and xrays.  Plan left total knee arthroplasty  Signed electronically by Cecilie Kicks, PA-C for Brent Cook

## 2016-11-02 ENCOUNTER — Encounter (HOSPITAL_COMMUNITY): Payer: Self-pay

## 2016-11-02 ENCOUNTER — Encounter (HOSPITAL_COMMUNITY)
Admission: RE | Admit: 2016-11-02 | Discharge: 2016-11-02 | Disposition: A | Discharge status: Home / Self Care | Payer: 59 | Source: Ambulatory Visit | Attending: Specialist | Admitting: Specialist

## 2016-11-02 DIAGNOSIS — Z01810 Encounter for preprocedural cardiovascular examination: Secondary | ICD-10-CM | POA: Diagnosis not present

## 2016-11-02 DIAGNOSIS — E119 Type 2 diabetes mellitus without complications: Secondary | ICD-10-CM | POA: Diagnosis not present

## 2016-11-02 DIAGNOSIS — Z01818 Encounter for other preprocedural examination: Secondary | ICD-10-CM | POA: Insufficient documentation

## 2016-11-02 DIAGNOSIS — T1490XS Injury, unspecified, sequela: Secondary | ICD-10-CM | POA: Insufficient documentation

## 2016-11-02 DIAGNOSIS — X58XXXS Exposure to other specified factors, sequela: Secondary | ICD-10-CM | POA: Diagnosis not present

## 2016-11-02 DIAGNOSIS — M1732 Unilateral post-traumatic osteoarthritis, left knee: Secondary | ICD-10-CM | POA: Insufficient documentation

## 2016-11-02 DIAGNOSIS — R9431 Abnormal electrocardiogram [ECG] [EKG]: Secondary | ICD-10-CM | POA: Diagnosis not present

## 2016-11-02 LAB — BASIC METABOLIC PANEL
Anion gap: 6 (ref 5–15)
BUN: 16 mg/dL (ref 6–20)
CALCIUM: 9.4 mg/dL (ref 8.9–10.3)
CO2: 30 mmol/L (ref 22–32)
Chloride: 102 mmol/L (ref 101–111)
Creatinine, Ser: 0.97 mg/dL (ref 0.61–1.24)
GFR calc Af Amer: >60 mL/min (ref >60)
GLUCOSE: 118 mg/dL — AB (ref 65–99)
Potassium: 3.5 mmol/L (ref 3.5–5.1)
SODIUM: 138 mmol/L (ref 135–145)

## 2016-11-02 LAB — URINALYSIS, ROUTINE W REFLEX MICROSCOPIC
Bacteria, UA: NONE SEEN
Bilirubin Urine: NEGATIVE
GLUCOSE, UA: NEGATIVE mg/dL
KETONES UR: NEGATIVE mg/dL
Leukocytes, UA: NEGATIVE
Nitrite: NEGATIVE
PROTEIN: NEGATIVE mg/dL
SQUAMOUS EPITHELIAL / LPF: NONE SEEN
Specific Gravity, Urine: 1.008 (ref 1.005–1.030)
pH: 6 (ref 5.0–8.0)

## 2016-11-02 LAB — SURGICAL PCR SCREEN
MRSA, PCR: NEGATIVE
Staphylococcus aureus: NEGATIVE

## 2016-11-02 LAB — GLUCOSE, CAPILLARY: GLUCOSE-CAPILLARY: 108 mg/dL — AB (ref 65–99)

## 2016-11-02 LAB — CBC
HCT: 37 % — ABNORMAL LOW (ref 39.0–52.0)
Hemoglobin: 13.4 g/dL (ref 13.0–17.0)
MCH: 32 pg (ref 26.0–34.0)
MCHC: 36.2 g/dL — ABNORMAL HIGH (ref 30.0–36.0)
MCV: 88.3 fL (ref 78.0–100.0)
PLATELETS: 196 10*3/uL (ref 150–400)
RBC: 4.19 MIL/uL — ABNORMAL LOW (ref 4.22–5.81)
RDW: 13.3 % (ref 11.5–15.5)
WBC: 3.9 10*3/uL — AB (ref 4.0–10.5)

## 2016-11-02 LAB — PROTIME-INR
INR: 1.04
Prothrombin Time: 13.7 seconds (ref 11.4–15.2)

## 2016-11-02 LAB — APTT: aPTT: 32 seconds (ref 24–36)

## 2016-11-02 LAB — ABO/RH: ABO/RH(D): O NEG

## 2016-11-03 LAB — HEMOGLOBIN A1C
Hgb A1c MFr Bld: 6.1 % — ABNORMAL HIGH (ref 4.8–5.6)
MEAN PLASMA GLUCOSE: 128 mg/dL

## 2016-11-08 ENCOUNTER — Inpatient Hospital Stay (HOSPITAL_COMMUNITY): Payer: Non-veteran care

## 2016-11-08 ENCOUNTER — Inpatient Hospital Stay (HOSPITAL_COMMUNITY): Payer: Non-veteran care | Admitting: Anesthesiology

## 2016-11-08 ENCOUNTER — Inpatient Hospital Stay (HOSPITAL_COMMUNITY)
Admission: RE | Admit: 2016-11-08 | Discharge: 2016-11-10 | DRG: 470 | Disposition: A | Discharge status: Home Health | Payer: Non-veteran care | Source: Ambulatory Visit | Attending: Specialist | Admitting: Specialist

## 2016-11-08 ENCOUNTER — Encounter (HOSPITAL_COMMUNITY)
Admission: RE | Disposition: A | Discharge status: Home Health | Payer: Self-pay | Source: Ambulatory Visit | Attending: Specialist

## 2016-11-08 ENCOUNTER — Encounter (HOSPITAL_COMMUNITY): Payer: Self-pay | Admitting: *Deleted

## 2016-11-08 DIAGNOSIS — E119 Type 2 diabetes mellitus without complications: Secondary | ICD-10-CM | POA: Diagnosis present

## 2016-11-08 DIAGNOSIS — I10 Essential (primary) hypertension: Secondary | ICD-10-CM | POA: Diagnosis present

## 2016-11-08 DIAGNOSIS — M1990 Unspecified osteoarthritis, unspecified site: Secondary | ICD-10-CM | POA: Diagnosis not present

## 2016-11-08 DIAGNOSIS — Z7984 Long term (current) use of oral hypoglycemic drugs: Secondary | ICD-10-CM | POA: Diagnosis not present

## 2016-11-08 DIAGNOSIS — M1732 Unilateral post-traumatic osteoarthritis, left knee: Secondary | ICD-10-CM | POA: Diagnosis not present

## 2016-11-08 DIAGNOSIS — Z96659 Presence of unspecified artificial knee joint: Secondary | ICD-10-CM

## 2016-11-08 DIAGNOSIS — M1712 Unilateral primary osteoarthritis, left knee: Secondary | ICD-10-CM | POA: Diagnosis present

## 2016-11-08 DIAGNOSIS — Z471 Aftercare following joint replacement surgery: Secondary | ICD-10-CM | POA: Diagnosis not present

## 2016-11-08 DIAGNOSIS — Z96652 Presence of left artificial knee joint: Secondary | ICD-10-CM | POA: Diagnosis not present

## 2016-11-08 DIAGNOSIS — M25562 Pain in left knee: Secondary | ICD-10-CM | POA: Diagnosis present

## 2016-11-08 HISTORY — PX: TOTAL KNEE ARTHROPLASTY: SHX125

## 2016-11-08 LAB — GLUCOSE, CAPILLARY
GLUCOSE-CAPILLARY: 218 mg/dL — AB (ref 65–99)
Glucose-Capillary: 142 mg/dL — ABNORMAL HIGH (ref 65–99)
Glucose-Capillary: 165 mg/dL — ABNORMAL HIGH (ref 65–99)
Glucose-Capillary: 274 mg/dL — ABNORMAL HIGH (ref 65–99)
Glucose-Capillary: 324 mg/dL — ABNORMAL HIGH (ref 65–99)

## 2016-11-08 LAB — TYPE AND SCREEN
ABO/RH(D): O NEG
Antibody Screen: NEGATIVE

## 2016-11-08 SURGERY — ARTHROPLASTY, KNEE, TOTAL
Anesthesia: Regional | Site: Knee | Laterality: Left

## 2016-11-08 MED ORDER — FENTANYL CITRATE (PF) 100 MCG/2ML IJ SOLN
INTRAMUSCULAR | Status: AC
  Filled 2016-11-08: qty 2

## 2016-11-08 MED ORDER — PROPOFOL 500 MG/50ML IV EMUL
INTRAVENOUS | Status: DC | PRN
  Administered 2016-11-08: 75 ug/kg/min via INTRAVENOUS

## 2016-11-08 MED ORDER — DEXTROSE 5 % IV SOLN
500.0000 mg | Freq: Four times a day (QID) | INTRAVENOUS | Status: DC | PRN
  Administered 2016-11-08: 500 mg via INTRAVENOUS
  Filled 2016-11-08: qty 550
  Filled 2016-11-08: qty 5

## 2016-11-08 MED ORDER — DIPHENHYDRAMINE HCL 12.5 MG/5ML PO ELIX: 12.5000 mg | ORAL_SOLUTION | ORAL | Status: DC | PRN

## 2016-11-08 MED ORDER — METOCLOPRAMIDE HCL 5 MG PO TABS: 5.0000 mg | ORAL_TABLET | Freq: Three times a day (TID) | ORAL | Status: DC | PRN

## 2016-11-08 MED ORDER — BUPIVACAINE HCL (PF) 0.5 % IJ SOLN
INTRAMUSCULAR | Status: AC
  Filled 2016-11-08: qty 30

## 2016-11-08 MED ORDER — OXYCODONE HCL 5 MG PO TABS: 5.0000 mg | ORAL_TABLET | ORAL | 0 refills | Status: DC | PRN

## 2016-11-08 MED ORDER — CEFAZOLIN SODIUM-DEXTROSE 2-4 GM/100ML-% IV SOLN
2.0000 g | Freq: Four times a day (QID) | INTRAVENOUS | Status: AC
  Administered 2016-11-08 – 2016-11-09 (×3): 2 g via INTRAVENOUS
  Filled 2016-11-08 (×3): qty 100

## 2016-11-08 MED ORDER — BISACODYL 5 MG PO TBEC: 5.0000 mg | DELAYED_RELEASE_TABLET | Freq: Every day | ORAL | Status: DC | PRN

## 2016-11-08 MED ORDER — RISAQUAD PO CAPS
1.0000 | ORAL_CAPSULE | Freq: Every day | ORAL | Status: DC
  Administered 2016-11-08 – 2016-11-10 (×3): 1 via ORAL
  Filled 2016-11-08 (×3): qty 1

## 2016-11-08 MED ORDER — PHENYLEPHRINE 40 MCG/ML (10ML) SYRINGE FOR IV PUSH (FOR BLOOD PRESSURE SUPPORT)
PREFILLED_SYRINGE | INTRAVENOUS | Status: AC
  Filled 2016-11-08: qty 10

## 2016-11-08 MED ORDER — POLYETHYLENE GLYCOL 3350 17 G PO PACK: 17.0000 g | PACK | Freq: Every day | ORAL | 0 refills | Status: DC

## 2016-11-08 MED ORDER — ACETAMINOPHEN 325 MG PO TABS
650.0000 mg | ORAL_TABLET | Freq: Four times a day (QID) | ORAL | Status: DC | PRN
Start: 2016-11-08 — End: 2016-11-10
  Administered 2016-11-09: 12:00:00 650 mg via ORAL
  Filled 2016-11-08: qty 2

## 2016-11-08 MED ORDER — ACETAMINOPHEN 10 MG/ML IV SOLN
INTRAVENOUS | Status: AC
  Filled 2016-11-08: qty 100

## 2016-11-08 MED ORDER — METOCLOPRAMIDE HCL 5 MG/ML IJ SOLN: 5.0000 mg | Freq: Three times a day (TID) | INTRAMUSCULAR | Status: DC | PRN

## 2016-11-08 MED ORDER — METHOCARBAMOL 500 MG PO TABS: 500.0000 mg | ORAL_TABLET | Freq: Four times a day (QID) | ORAL | 1 refills | Status: DC | PRN

## 2016-11-08 MED ORDER — PROPOFOL 10 MG/ML IV BOLUS
INTRAVENOUS | Status: DC | PRN
  Administered 2016-11-08: 30 mg via INTRAVENOUS

## 2016-11-08 MED ORDER — MENTHOL 3 MG MT LOZG: 1.0000 | LOZENGE | OROMUCOSAL | Status: DC | PRN

## 2016-11-08 MED ORDER — SODIUM CHLORIDE 0.9 % IR SOLN
Status: DC | PRN
  Administered 2016-11-08: 2000 mL

## 2016-11-08 MED ORDER — BUPIVACAINE IN DEXTROSE 0.75-8.25 % IT SOLN
INTRATHECAL | Status: DC | PRN
  Administered 2016-11-08: 2 mL via INTRATHECAL

## 2016-11-08 MED ORDER — MIDAZOLAM HCL 2 MG/2ML IJ SOLN
INTRAMUSCULAR | Status: AC
  Filled 2016-11-08: qty 2

## 2016-11-08 MED ORDER — ROPIVACAINE HCL 7.5 MG/ML IJ SOLN
INTRAMUSCULAR | Status: AC
  Filled 2016-11-08: qty 20

## 2016-11-08 MED ORDER — DEXAMETHASONE SODIUM PHOSPHATE 10 MG/ML IJ SOLN
INTRAMUSCULAR | Status: AC
  Filled 2016-11-08: qty 1

## 2016-11-08 MED ORDER — ACETAMINOPHEN 10 MG/ML IV SOLN
INTRAVENOUS | Status: DC | PRN
  Administered 2016-11-08: 1000 mg via INTRAVENOUS

## 2016-11-08 MED ORDER — DEXAMETHASONE SODIUM PHOSPHATE 10 MG/ML IJ SOLN
INTRAMUSCULAR | Status: DC | PRN
  Administered 2016-11-08: 10 mg via INTRAVENOUS

## 2016-11-08 MED ORDER — POLYMYXIN B SULFATE 500000 UNITS IJ SOLR
INTRAMUSCULAR | Status: AC
  Filled 2016-11-08: qty 500000

## 2016-11-08 MED ORDER — ASPIRIN EC 325 MG PO TBEC: 325.0000 mg | DELAYED_RELEASE_TABLET | Freq: Two times a day (BID) | ORAL | 1 refills | Status: DC

## 2016-11-08 MED ORDER — FENTANYL CITRATE (PF) 100 MCG/2ML IJ SOLN
INTRAMUSCULAR | Status: DC | PRN
  Administered 2016-11-08: 50 ug via INTRAVENOUS
  Administered 2016-11-08: 100 ug via INTRAVENOUS
  Administered 2016-11-08 (×2): 25 ug via INTRAVENOUS

## 2016-11-08 MED ORDER — TRANEXAMIC ACID 1000 MG/10ML IV SOLN
1000.0000 mg | INTRAVENOUS | Status: AC
  Administered 2016-11-08: 1000 mg via INTRAVENOUS
  Filled 2016-11-08: qty 1100

## 2016-11-08 MED ORDER — HYDROMORPHONE HCL 1 MG/ML IJ SOLN
1.0000 mg | INTRAMUSCULAR | Status: DC | PRN
  Administered 2016-11-09 (×3): 1 mg via INTRAVENOUS
  Filled 2016-11-08 (×3): qty 1

## 2016-11-08 MED ORDER — MEPERIDINE HCL 50 MG/ML IJ SOLN: 6.2500 mg | INTRAMUSCULAR | Status: DC | PRN

## 2016-11-08 MED ORDER — METHOCARBAMOL 500 MG PO TABS
500.0000 mg | ORAL_TABLET | Freq: Four times a day (QID) | ORAL | Status: DC | PRN
  Administered 2016-11-08 – 2016-11-10 (×7): 500 mg via ORAL
  Filled 2016-11-08 (×7): qty 1

## 2016-11-08 MED ORDER — PHENOL 1.4 % MT LIQD: 1.0000 | OROMUCOSAL | Status: DC | PRN

## 2016-11-08 MED ORDER — OXYCODONE HCL 5 MG PO TABS
5.0000 mg | ORAL_TABLET | ORAL | Status: DC | PRN
Start: 2016-11-08 — End: 2016-11-10
  Administered 2016-11-08 (×2): 10 mg via ORAL
  Administered 2016-11-08: 5 mg via ORAL
  Administered 2016-11-09 (×5): 10 mg via ORAL
  Filled 2016-11-08: qty 1
  Filled 2016-11-08 (×7): qty 2

## 2016-11-08 MED ORDER — ALUM & MAG HYDROXIDE-SIMETH 200-200-20 MG/5ML PO SUSP: 30.0000 mL | ORAL | Status: DC | PRN

## 2016-11-08 MED ORDER — HYDROMORPHONE HCL 1 MG/ML IJ SOLN: 0.2500 mg | INTRAMUSCULAR | Status: DC | PRN

## 2016-11-08 MED ORDER — ONDANSETRON HCL 4 MG PO TABS: 4.0000 mg | ORAL_TABLET | Freq: Four times a day (QID) | ORAL | Status: DC | PRN

## 2016-11-08 MED ORDER — INSULIN ASPART 100 UNIT/ML ~~LOC~~ SOLN
0.0000 [IU] | Freq: Three times a day (TID) | SUBCUTANEOUS | Status: DC
  Administered 2016-11-08: 17:00:00 11 [IU] via SUBCUTANEOUS
  Administered 2016-11-08: 14:00:00 8 [IU] via SUBCUTANEOUS
  Administered 2016-11-09 – 2016-11-10 (×4): 5 [IU] via SUBCUTANEOUS
  Administered 2016-11-10: 13:00:00 8 [IU] via SUBCUTANEOUS

## 2016-11-08 MED ORDER — LACTATED RINGERS IV SOLN
INTRAVENOUS | Status: DC
  Administered 2016-11-08 (×2): via INTRAVENOUS

## 2016-11-08 MED ORDER — CEFAZOLIN SODIUM-DEXTROSE 2-4 GM/100ML-% IV SOLN
2.0000 g | INTRAVENOUS | Status: AC
  Administered 2016-11-08: 2 g via INTRAVENOUS
  Filled 2016-11-08: qty 100

## 2016-11-08 MED ORDER — MAGNESIUM CITRATE PO SOLN: 1.0000 | Freq: Once | ORAL | Status: DC | PRN

## 2016-11-08 MED ORDER — HYDROCHLOROTHIAZIDE 25 MG PO TABS
37.5000 mg | ORAL_TABLET | Freq: Every day | ORAL | Status: DC
  Administered 2016-11-09: 22:00:00 37.5 mg via ORAL
  Filled 2016-11-08: qty 2

## 2016-11-08 MED ORDER — SODIUM CHLORIDE 0.9 % IR SOLN
Status: DC | PRN
  Administered 2016-11-08: 500 mL

## 2016-11-08 MED ORDER — ACETAMINOPHEN 650 MG RE SUPP: 650.0000 mg | Freq: Four times a day (QID) | RECTAL | Status: DC | PRN

## 2016-11-08 MED ORDER — PHENYLEPHRINE 40 MCG/ML (10ML) SYRINGE FOR IV PUSH (FOR BLOOD PRESSURE SUPPORT)
PREFILLED_SYRINGE | INTRAVENOUS | Status: DC | PRN
  Administered 2016-11-08: 80 ug via INTRAVENOUS

## 2016-11-08 MED ORDER — PROPOFOL 10 MG/ML IV BOLUS
INTRAVENOUS | Status: AC
  Filled 2016-11-08: qty 20

## 2016-11-08 MED ORDER — DOCUSATE SODIUM 100 MG PO CAPS: 100.0000 mg | ORAL_CAPSULE | Freq: Two times a day (BID) | ORAL | 1 refills | Status: DC | PRN

## 2016-11-08 MED ORDER — ROPIVACAINE HCL 7.5 MG/ML IJ SOLN
INTRAMUSCULAR | Status: DC | PRN
  Administered 2016-11-08: 20 mL via PERINEURAL

## 2016-11-08 MED ORDER — ONDANSETRON HCL 4 MG/2ML IJ SOLN: 4.0000 mg | Freq: Four times a day (QID) | INTRAMUSCULAR | Status: DC | PRN

## 2016-11-08 MED ORDER — ASPIRIN EC 325 MG PO TBEC
325.0000 mg | DELAYED_RELEASE_TABLET | Freq: Two times a day (BID) | ORAL | Status: DC
  Administered 2016-11-08 – 2016-11-10 (×4): 325 mg via ORAL
  Filled 2016-11-08 (×4): qty 1

## 2016-11-08 MED ORDER — BUPIVACAINE-EPINEPHRINE (PF) 0.5% -1:200000 IJ SOLN
INTRAMUSCULAR | Status: AC
  Filled 2016-11-08: qty 30

## 2016-11-08 MED ORDER — PROMETHAZINE HCL 25 MG/ML IJ SOLN: 6.2500 mg | INTRAMUSCULAR | Status: DC | PRN

## 2016-11-08 MED ORDER — ONDANSETRON HCL 4 MG/2ML IJ SOLN
INTRAMUSCULAR | Status: AC
  Filled 2016-11-08: qty 2

## 2016-11-08 MED ORDER — POLYETHYLENE GLYCOL 3350 17 G PO PACK: 17.0000 g | PACK | Freq: Every day | ORAL | Status: DC | PRN

## 2016-11-08 MED ORDER — ONDANSETRON HCL 4 MG/2ML IJ SOLN
INTRAMUSCULAR | Status: DC | PRN
  Administered 2016-11-08: 4 mg via INTRAVENOUS

## 2016-11-08 MED ORDER — STERILE WATER FOR IRRIGATION IR SOLN
Status: DC | PRN
  Administered 2016-11-08: 2000 mL

## 2016-11-08 MED ORDER — BUPIVACAINE-EPINEPHRINE 0.5% -1:200000 IJ SOLN
INTRAMUSCULAR | Status: DC | PRN
  Administered 2016-11-08: 30 mg

## 2016-11-08 MED ORDER — CEFAZOLIN SODIUM-DEXTROSE 2-4 GM/100ML-% IV SOLN
INTRAVENOUS | Status: AC
  Filled 2016-11-08: qty 100

## 2016-11-08 MED ORDER — POTASSIUM CHLORIDE IN NACL 20-0.9 MEQ/L-% IV SOLN: INTRAVENOUS | Status: DC

## 2016-11-08 MED ORDER — MIDAZOLAM HCL 2 MG/2ML IJ SOLN: 0.5000 mg | Freq: Once | INTRAMUSCULAR | Status: DC | PRN

## 2016-11-08 MED ORDER — DOCUSATE SODIUM 100 MG PO CAPS
100.0000 mg | ORAL_CAPSULE | Freq: Two times a day (BID) | ORAL | Status: DC
  Administered 2016-11-08 – 2016-11-10 (×4): 100 mg via ORAL
  Filled 2016-11-08 (×4): qty 1

## 2016-11-08 MED ORDER — MIDAZOLAM HCL 5 MG/5ML IJ SOLN
INTRAMUSCULAR | Status: DC | PRN
  Administered 2016-11-08: 2 mg via INTRAVENOUS

## 2016-11-08 MED ORDER — LOSARTAN POTASSIUM 50 MG PO TABS
50.0000 mg | ORAL_TABLET | Freq: Every day | ORAL | Status: DC
  Administered 2016-11-08 – 2016-11-09 (×2): 50 mg via ORAL
  Filled 2016-11-08 (×2): qty 1

## 2016-11-08 MED ORDER — PROPOFOL 10 MG/ML IV BOLUS
INTRAVENOUS | Status: AC
  Filled 2016-11-08: qty 80

## 2016-11-08 MED ORDER — SODIUM CHLORIDE 0.9 % IV SOLN
INTRAVENOUS | Status: DC
  Administered 2016-11-08: 14:00:00 via INTRAVENOUS
  Filled 2016-11-08 (×5): qty 1000

## 2016-11-08 MED ORDER — LIDOCAINE-EPINEPHRINE (PF) 1 %-1:200000 IJ SOLN
INTRAMUSCULAR | Status: AC
  Filled 2016-11-08: qty 30

## 2016-11-08 SURGICAL SUPPLY — 61 items
BAG ZIPLOCK 12X15 (MISCELLANEOUS) IMPLANT
BANDAGE ACE 4X5 VEL STRL LF (GAUZE/BANDAGES/DRESSINGS) ×3 IMPLANT
BANDAGE ACE 6X5 VEL STRL LF (GAUZE/BANDAGES/DRESSINGS) ×3 IMPLANT
BLADE SAG 18X100X1.27 (BLADE) ×3 IMPLANT
BLADE SAW SGTL 11.0X1.19X90.0M (BLADE) ×3 IMPLANT
BLADE SAW SGTL 13.0X1.19X90.0M (BLADE) ×3 IMPLANT
CAPT KNEE TOTAL 3 ATTUNE ×3 IMPLANT
CEMENT HV SMART SET (Cement) ×6 IMPLANT
CLOSURE WOUND 1/2 X4 (GAUZE/BANDAGES/DRESSINGS)
CLOTH 2% CHLOROHEXIDINE 3PK (PERSONAL CARE ITEMS) ×3 IMPLANT
CUFF TOURN SGL QUICK 34 (TOURNIQUET CUFF) ×2
CUFF TRNQT CYL 34X4X40X1 (TOURNIQUET CUFF) ×1 IMPLANT
DECANTER SPIKE VIAL GLASS SM (MISCELLANEOUS) IMPLANT
DRAPE INCISE IOBAN 66X45 STRL (DRAPES) IMPLANT
DRAPE ORTHO SPLIT 77X108 STRL (DRAPES) ×4
DRAPE SHEET LG 3/4 BI-LAMINATE (DRAPES) ×3 IMPLANT
DRAPE SURG ORHT 6 SPLT 77X108 (DRAPES) ×2 IMPLANT
DRAPE U-SHAPE 47X51 STRL (DRAPES) ×3 IMPLANT
DRSG AQUACEL AG ADV 3.5X10 (GAUZE/BANDAGES/DRESSINGS) ×3 IMPLANT
DRSG TEGADERM 4X4.75 (GAUZE/BANDAGES/DRESSINGS) IMPLANT
DURAPREP 26ML APPLICATOR (WOUND CARE) ×3 IMPLANT
ELECT REM PT RETURN 9FT ADLT (ELECTROSURGICAL) ×3
ELECTRODE REM PT RTRN 9FT ADLT (ELECTROSURGICAL) ×1 IMPLANT
EVACUATOR 1/8 PVC DRAIN (DRAIN) IMPLANT
GAUZE SPONGE 2X2 8PLY STRL LF (GAUZE/BANDAGES/DRESSINGS) IMPLANT
GLOVE BIOGEL PI IND STRL 7.0 (GLOVE) ×2 IMPLANT
GLOVE BIOGEL PI IND STRL 8 (GLOVE) ×1 IMPLANT
GLOVE BIOGEL PI INDICATOR 7.0 (GLOVE) ×4
GLOVE BIOGEL PI INDICATOR 8 (GLOVE) ×2
GLOVE SURG SS PI 7.0 STRL IVOR (GLOVE) ×6 IMPLANT
GLOVE SURG SS PI 7.5 STRL IVOR (GLOVE) IMPLANT
GLOVE SURG SS PI 8.0 STRL IVOR (GLOVE) ×9 IMPLANT
GOWN STRL REUS W/TWL XL LVL3 (GOWN DISPOSABLE) ×9 IMPLANT
HANDPIECE INTERPULSE COAX TIP (DISPOSABLE) ×2
HEMOSTAT SPONGE AVITENE ULTRA (HEMOSTASIS) IMPLANT
IMMOBILIZER KNEE 20 (SOFTGOODS) ×3
IMMOBILIZER KNEE 20 THIGH 36 (SOFTGOODS) ×1 IMPLANT
MANIFOLD NEPTUNE II (INSTRUMENTS) ×3 IMPLANT
NS IRRIG 1000ML POUR BTL (IV SOLUTION) IMPLANT
PACK TOTAL KNEE CUSTOM (KITS) ×3 IMPLANT
POSITIONER SURGICAL ARM (MISCELLANEOUS) ×3 IMPLANT
SET HNDPC FAN SPRY TIP SCT (DISPOSABLE) ×1 IMPLANT
SLEEVE SURGEON STRL (DRAPES) ×3 IMPLANT
SPONGE GAUZE 2X2 STER 10/PKG (GAUZE/BANDAGES/DRESSINGS)
SPONGE SURGIFOAM ABS GEL 100 (HEMOSTASIS) IMPLANT
STAPLER VISISTAT (STAPLE) IMPLANT
STRIP CLOSURE SKIN 1/2X4 (GAUZE/BANDAGES/DRESSINGS) IMPLANT
SUT BONE WAX W31G (SUTURE) ×3 IMPLANT
SUT MNCRL AB 4-0 PS2 18 (SUTURE) IMPLANT
SUT STRATAFIX 0 PDS 27 VIOLET (SUTURE) ×3
SUT VIC AB 1 CT1 27 (SUTURE) ×4
SUT VIC AB 1 CT1 27XBRD ANTBC (SUTURE) ×2 IMPLANT
SUT VIC AB 2-0 CT1 27 (SUTURE) ×6
SUT VIC AB 2-0 CT1 TAPERPNT 27 (SUTURE) ×3 IMPLANT
SUTURE STRATFX 0 PDS 27 VIOLET (SUTURE) ×1 IMPLANT
SYR 50ML LL SCALE MARK (SYRINGE) IMPLANT
TOWER CARTRIDGE SMART MIX (DISPOSABLE) ×3 IMPLANT
TRAY FOLEY W/METER SILVER 16FR (SET/KITS/TRAYS/PACK) ×3 IMPLANT
WATER STERILE IRR 1500ML POUR (IV SOLUTION) ×6 IMPLANT
WRAP KNEE MAXI GEL POST OP (GAUZE/BANDAGES/DRESSINGS) ×3 IMPLANT
YANKAUER SUCT BULB TIP 10FT TU (MISCELLANEOUS) ×3 IMPLANT

## 2016-11-08 NOTE — Transfer of Care (Signed)
Immediate Anesthesia Transfer of Care Note  Patient: Brent Cook  Procedure(s) Performed: Procedure(s) with comments: LEFT TOTAL KNEE ARTHROPLASTY (Left) - with block  Patient Location: PACU  Anesthesia Type:Spinal  Level of Consciousness:  sedated, patient cooperative and responds to stimulation  Airway & Oxygen Therapy:Patient Spontanous Breathing and Patient connected to face mask oxgen  Post-op Assessment:  Report given to PACU RN and Post -op Vital signs reviewed and stable  Post vital signs:  Reviewed and stable  Last Vitals:  Vitals:   11/08/16 0514  BP: (!) 147/85  Pulse: 81  Resp: 18  Temp: [id number] C    Complications: No apparent anesthesia complications

## 2016-11-08 NOTE — Evaluation (Signed)
Physical Therapy Evaluation Patient Details Name: Brent Cook MRN: [id number] DOB: November 21, 1960 Today's Date: 11/08/2016   History of Present Illness  Pt s/p L TKR  Clinical Impression  Pt s/p L TKR and presents with decreased L LE strength/ROM and post op pain limiting functional mobility.  Pt should progress to dc home with family assist and HHPT follow up.    Follow Up Recommendations Home health PT    Equipment Recommendations  Rolling walker with 5" wheels    Recommendations for Other Services OT consult     Precautions / Restrictions Precautions Precautions: Fall Restrictions Weight Bearing Restrictions: No Other Position/Activity Restrictions: WBAT      Mobility  Bed Mobility Overal bed mobility: Needs Assistance Bed Mobility: Supine to Sit;Sit to Supine     Supine to sit: Min assist Sit to supine: Min assist   General bed mobility comments: cues for sequence with min assist to manage L LE  Transfers Overall transfer level: Needs assistance Equipment used: Rolling walker (2 wheeled) Transfers: Sit to/from Stand Sit to Stand: Min assist         General transfer comment: cues for LE management and use of UEs to self assist  Ambulation/Gait Ambulation/Gait assistance: Min assist Ambulation Distance (Feet): 34 Feet Assistive device: Rolling walker (2 wheeled) Gait Pattern/deviations: Step-to pattern;Decreased step length - right;Decreased step length - left;Shuffle;Trunk flexed Gait velocity: decr Gait velocity interpretation: Below normal speed for age/gender General Gait Details: cues for sequence, posture and position from [employer]            Wheelchair Mobility    Modified Rankin (Stroke Patients Only)       Balance                                             Pertinent Vitals/Pain Pain Assessment: 0-10 Pain Score: 4  Pain Location: L knee Pain Descriptors / Indicators: Aching;Sore Pain Intervention(s):  Limited activity within patient's tolerance;Monitored during session;Premedicated before session;Ice applied    Home Living Family/patient expects to be discharged to:: Private residence Living Arrangements: Parent Available Help at Discharge: Family Type of Home: House Home Access: Stairs to enter Entrance Stairs-Rails: None Entrance Stairs-Number of Steps: 4 Home Layout: Able to live on main level with bedroom/bathroom Home Equipment: None      Prior Function Level of Independence: Independent               Hand Dominance        Extremity/Trunk Assessment   Upper Extremity Assessment Upper Extremity Assessment: Overall WFL for tasks assessed    Lower Extremity Assessment Lower Extremity Assessment: LLE deficits/detail    Cervical / Trunk Assessment Cervical / Trunk Assessment: Normal  Communication   Communication: No difficulties  Cognition Arousal/Alertness: Awake/alert Behavior During Therapy: WFL for tasks assessed/performed Overall Cognitive Status: Within Functional Limits for tasks assessed                      General Comments      Exercises     Assessment/Plan    PT Assessment Patient needs continued PT services  PT Problem List Decreased strength;Decreased range of motion;Decreased activity tolerance;Decreased mobility;Decreased knowledge of use of DME;Pain          PT Treatment Interventions DME instruction;Gait training;Stair training;Functional mobility training;Therapeutic activities;Therapeutic exercise;Patient/family education    PT Goals (  Current goals can be found in the Care Plan section)  Acute Rehab PT Goals Patient Stated Goal: Regain IND PT Goal Formulation: With patient Time For Goal Achievement: 11/14/16 Potential to Achieve Goals: Good    Frequency 7X/week   Barriers to discharge        Co-evaluation               End of Session Equipment Utilized During Treatment: Gait belt;Left knee  immobilizer Activity Tolerance: Patient tolerated treatment well Patient left: in bed;with call bell/phone within reach Nurse Communication: Mobility status         Time: [id number] PT Time Calculation (min) (ACUTE ONLY): 20 min   Charges:   PT Evaluation $PT Eval Low Complexity: 1 Procedure     PT G Codes:        Elesha Thedford 11/12/2016, 5:35 PM

## 2016-11-08 NOTE — Anesthesia Preprocedure Evaluation (Addendum)
Anesthesia Evaluation  Patient identified by MRN, date of birth, ID band Patient awake    Reviewed: Allergy & Precautions, NPO status , Patient's Chart, lab work & pertinent test results  History of Anesthesia Complications Negative for: history of anesthetic complications  Airway Mallampati: II  TM Distance: >3 FB Neck ROM: Full    Dental  (+) Dental Advisory Given   Pulmonary neg pulmonary ROS,    breath sounds clear to auscultation       Cardiovascular hypertension, Pt. on medications (-) angina Rhythm:Regular Rate:Normal     Neuro/Psych negative neurological ROS     GI/Hepatic negative GI ROS, Neg liver ROS,   Endo/Other  diabetes (glu 142), Oral Hypoglycemic AgentsMorbid obesity  Renal/GU negative Renal ROS     Musculoskeletal  (+) Arthritis , Osteoarthritis,    Abdominal (+) + obese,   Peds  Hematology negative hematology ROS (+)   Anesthesia Other Findings   Reproductive/Obstetrics                            Anesthesia Physical Anesthesia Plan  ASA: II  Anesthesia Plan: Spinal   Post-op Pain Management:  Regional for Post-op pain   Induction:   Airway Management Planned: Natural Airway and Simple Face Mask  Additional Equipment:   Intra-op Plan:   Post-operative Plan:   Informed Consent: I have reviewed the patients History and Physical, chart, labs and discussed the procedure including the risks, benefits and alternatives for the proposed anesthesia with the patient or authorized representative who has indicated his/her understanding and acceptance.   Dental advisory given  Plan Discussed with: CRNA and Surgeon  Anesthesia Plan Comments: (Plan routine monitors, SAB with adductor canal block for post op analgesia)        Anesthesia Quick Evaluation

## 2016-11-08 NOTE — Anesthesia Procedure Notes (Signed)
Spinal  Patient location during procedure: OR Start time: 11/08/2016 7:15 AM End time: 11/08/2016 7:25 AM Staffing Resident/CRNA: Aileene Lanum Performed: resident/CRNA  Preanesthetic Checklist Completed: patient identified, site marked, surgical consent, pre-op evaluation, timeout performed, IV checked, risks and benefits discussed and monitors and equipment checked Spinal Block Patient position: sitting Prep: DuraPrep Patient monitoring: heart rate, cardiac monitor, continuous pulse ox and blood pressure Approach: midline Location: L3-4 Injection technique: single-shot Needle Needle type: Pencan  Needle gauge: 24 G Needle length: 10 cm Needle insertion depth: 8 cm Assessment Sensory level: T6 Additional Notes -heme, -para, VSS Pt tolerated well.  Lot and exp date OK.

## 2016-11-08 NOTE — Anesthesia Postprocedure Evaluation (Signed)
Anesthesia Post Note  Patient: Brent Cook  Procedure(s) Performed: Procedure(s) (LRB): LEFT TOTAL KNEE ARTHROPLASTY (Left)  Patient location during evaluation: PACU Anesthesia Type: Spinal and Regional Level of consciousness: awake and alert, oriented and patient cooperative Pain management: pain level controlled Vital Signs Assessment: post-procedure vital signs reviewed and stable Respiratory status: nonlabored ventilation, spontaneous breathing, respiratory function stable and patient connected to nasal cannula oxygen Cardiovascular status: blood pressure returned to baseline and stable Postop Assessment: spinal receding Anesthetic complications: no       Last Vitals:  Vitals:   11/08/16 1249 11/08/16 1342  BP: 136/78 134/79  Pulse: 84 84  Resp: 16 18  Temp: 36.4 C 36.6 C    Last Pain:  Vitals:   11/08/16 1342  TempSrc: Oral  PainSc:                  Erza Mothershead,E. Annika Selke

## 2016-11-08 NOTE — Interval H&P Note (Signed)
History and Physical Interval Note:  11/08/2016 6:58 AM  Brent Cook  has presented today for surgery, with the diagnosis of Left knee post-traumatic degenerative joint disease  The various methods of treatment have been discussed with the patient and family. After consideration of risks, benefits and other options for treatment, the patient has consented to  Procedure(s): LEFT TOTAL KNEE ARTHROPLASTY (Left) as a surgical intervention .  The patient's history has been reviewed, patient examined, no change in status, stable for surgery.  I have reviewed the patient's chart and labs.  Questions were answered to the patient's satisfaction.     [name] C

## 2016-11-08 NOTE — Op Note (Signed)
NAMESAFAL, KASTER NO.:  [id number]  MEDICAL RECORD NO.:  [id number]  LOCATION:  [facility]                         FACILITY:  [facility]  PHYSICIAN:  Susa Day, M.D.    DATE OF BIRTH:  [birth year]  DATE OF PROCEDURE:  11/08/2016 DATE OF DISCHARGE:                              OPERATIVE REPORT   PREOPERATIVE DIAGNOSES:  End-stage osteoarthrosis of the left knee, degenerative joint disease.  POSTOPERATIVE DIAGNOSES:  End-stage osteoarthrosis of the left knee, degenerative joint disease.  PROCEDURE PERFORMED:  Left total knee arthroplasty.  COMPONENTS:  DePuy Attune 6 femur, 6 tibia, 5 insert, 41 patella.  ANESTHESIA:  Spinal.  ASSISTANT:  Lacie Draft, PA.  HISTORY:  A 56 year old, bone-on-bone arthrosis patellofemoral joint, medial compartment, refractory to conservative treatment including rest, activity modification, arthroscopy, cortisone, viscosupplementation, negative effect to his activities of daily living, and disabling, was indicated for placement of degenerated joint.  Risks and benefits discussed including bleeding, infection, damage to neurovascular structures, suboptimal range of motion, DVT, PE, anesthetic complications, etc.  TECHNIQUE:  With the patient in a supine position, after induction of adequate spinal anesthesia, adductor block and Kefzol, the patient was placed in [facility] holder.  Left lower extremity was prepped and draped and exsanguinated in usual sterile fashion.  Thigh tourniquet inflated to 275 mmHg.  Midline incision was then made over the skin, full- thickness flaps developed, median parapatellar arthrotomy performed, patella everted, knee flexed.  There was tricompartmental osteoporosis, fair amount of scar tissue from previous surgery dehiscing the patellar fat pad.  This was debrided, removed remnants of medial and lateral meniscus as well as the ACL.  Fairly tight joint.  We had elevated soft tissues medially preserving  the MCL.  Removed osteophytes with a rongeur.  Step drill was utilized to enter the femoral canal, it was irrigated.  A 5 degree left was used with 9 off the distal femur, 3 degrees of external rotation, it was 9 off the distal femur.  This was then pinned.  We performed a distal femoral cut.  We then attempted to sublux the tibia, we were unsuccessful.  We turned our attention back towards the femur.  Osteophytes were removed posteriorly.  Again, a fairly tight joint.  The patient had fairly hard bone.  We measured it off the anterior cortex to a 6, pinned in 3 degrees of external rotation.  We used a 6.  Applied that block, performed anterior, posterior, and chamfer cuts.  We did notch the femur.  He had a slightly internally rotated femur prior to this cut.  We were able to sublux the tibia too.  We looked at 2 and then 4 off the defect, as that was medially to take 10 laterally bisecting the tibiotalar joint.  3-degree slope parallel to the shaft.  This was then pinned.  We performed our proximal tibial cut.  Flexion/extension gap was equivalent.  We then subluxed the tibia, maximized the trial to a 6 to the medial aspect of the tibial tubercle was pinned.  We drilled our hole centrally, used our punch guide, harvested bone from the proximal tibia for the femoral canal and impacted that.  We then performed our  box cut bisecting the canal.  Performed the box cut with an oscillating saw.  We then put our trial femur and tibia, drilled our lug holes, 5 insert, we had full extension, full flexion, good stability, varus and valgus stressing 0-30 degrees.  Negative anterior drawer.  Next, we turned our attention towards the patella, it was measured to a 25, planed to a 16 utilizing a patellar jig.  We then drilled our PEG holes medializing those so that the paddle was parallel to the joint, it measured at a 41.  We then placed our trial patella, reduced it, and had excellent patellofemoral  tracking.  All trials were removed.  We used pulsatile lavage in the joint, checked posteriorly.  Popliteus was intact.  Cauterized the geniculates.  Capsule was intact.  We then flexed the knee, dried all surfaces thoroughly, mixed cement on back table in appropriate fashion and injected it into the tibial canal digitally pressurizing the cement.  We then impacted our tibial tray, redundant cement removed.  We cemented and impacted the femur, redundant cement removed.  Placed a trial insert, reduced it, held axial load throughout the curing of the cement and then cemented and clamped our patella.  After full curing of the cement, we meticulously removed all redundant cement.  We trialed the insert, it was satisfactory.  We then removed it.  Checked post pulsatile lavage and antibiotic irrigation. Subluxed the tibia, chose 5 insert, reduced it, had full extension, full flexion, good stability, varus and valgus stressing, 0-30 degrees. Negative anterior drawer and excellent patellofemoral tracking. Irrigated the wound.  Just after the cement had cured, we released tourniquet and there was adequate revascularization in the lower extremity appreciated at 74 minutes.  No significant active bleeding. We then closed our patellar arthrotomy with 1 Vicryl interrupted figure- of-eight sutures oversewn with a running STRATAFIX, subcu with 2-0, and skin with staples.  Wound was dressed sterilely.  Placed in immobilizer and then transported to the recovery room in a satisfactory condition.  The patient tolerated the procedure well.  No complications.  ASSISTANT:  Lacie Draft, PA.  Minimal blood loss.  He had excellent patellofemoral tracking after closure and flexion to gravity at 90 degrees.     Susa Day, M.D.     Geralynn Rile  D:  11/08/2016  T:  11/08/2016  Job:  [id number]

## 2016-11-08 NOTE — Brief Op Note (Signed)
11/08/2016  9:36 AM  PATIENT:  Brent Cook  56 y.o. male  PRE-OPERATIVE DIAGNOSIS:  Left knee post-traumatic degenerative joint disease  POST-OPERATIVE DIAGNOSIS:  Left knee post-traumatic degenerative joint disease  PROCEDURE:  Procedure(s) with comments: LEFT TOTAL KNEE ARTHROPLASTY (Left) - with block  SURGEON:  Surgeon(s) and Role:    * Susa Day, MD - Primary  PHYSICIAN ASSISTANT:   ASSISTANTS: Bissell   ANESTHESIA:   general  EBL:  Total I/O In: 1000 [I.V.:1000] Out: 200 [Urine:150; Blood:50]  BLOOD ADMINISTERED:none  DRAINS: none   LOCAL MEDICATIONS USED:  MARCAINE     SPECIMEN:  No Specimen  DISPOSITION OF SPECIMEN:  N/A  COUNTS:  YES  TOURNIQUET:   Total Tourniquet Time Documented: Thigh (Left) - 74 minutes Total: Thigh (Left) - 74 minutes   DICTATION: .Other Dictation: Dictation Number  [id number]  PLAN OF CARE: Admit for overnight observation  PATIENT DISPOSITION:  PACU - hemodynamically stable.   Delay start of Pharmacological VTE agent (>24hrs) due to surgical blood loss or risk of bleeding: yes

## 2016-11-08 NOTE — Anesthesia Procedure Notes (Signed)
Anesthesia Regional Block:  Adductor canal block  Pre-Anesthetic Checklist: ,, timeout performed, Correct Patient, Correct Site, Correct Laterality, Correct Procedure, Correct Position, site marked, Risks and benefits discussed,  Surgical consent,  Pre-op evaluation,  At surgeon's request and post-op pain management  Laterality: Left and Lower  Prep: chloraprep       Needles:  Injection technique: Single-shot  Needle Type: Echogenic Needle     Needle Length: 9cm 9 cm Needle Gauge: 21 and 21 G    Additional Needles:  Procedures: ultrasound guided (picture in chart) Adductor canal block Narrative:  Start time: 11/08/2016 7:05 AM End time: 11/08/2016 7:11 AM Injection made incrementally with aspirations every 5 mL.  Performed by: Personally  Anesthesiologist: Glennon Mac, Jamey Harman  Additional Notes: Pt identified in Holding room.  Monitors applied. Working IV access confirmed. Sterile prep L thigh.  #21ga ECHOgenic needle into adductor canal with US guidance.  20cc 0.5% Ropivacaine injected incrementally after negative test dose.  Patient asymptomatic, VSS, no heme aspirated, tolerated well.  Jenita Seashore, MD

## 2016-11-08 NOTE — Discharge Instructions (Signed)
Elevate leg above heart 6x a day for 57minutes each Use knee immobilizer while walking until can SLR x 10 Use knee immobilizer in bed to keep knee in extension Aquacel dressing may remain in place until follow up. May shower with aquacel dressing in place. If the dressing becomes saturated or peels off, you may remove aquacel dressing. Place new dressing with gauze and tape or ACE bandage which should be kept clean and dry and changed daily.  .INSTRUCTIONS AFTER JOINT REPLACEMENT   o Remove items at home which could result in a fall. This includes throw rugs or furniture in walking pathways o ICE to the affected joint every three hours while awake for 30 minutes at a time, for at least the first 3-5 days, and then as needed for pain and swelling.  Continue to use ice for pain and swelling. You may notice swelling that will progress down to the foot and ankle.  This is normal after surgery.  Elevate your leg when you are not up walking on it.   o Continue to use the breathing machine you got in the hospital (incentive spirometer) which will help keep your temperature down.  It is common for your temperature to cycle up and down following surgery, especially at night when you are not up moving around and exerting yourself.  The breathing machine keeps your lungs expanded and your temperature down.   DIET:  As you were doing prior to hospitalization, we recommend a well-balanced diet.  DRESSING / WOUND CARE / SHOWERING  Keep the surgical dressing until follow up.  The dressing is water proof, so you can shower without any extra covering.  IF THE DRESSING FALLS OFF or the wound gets wet inside, change the dressing with sterile gauze.  Please use good hand washing techniques before changing the dressing.  Do not use any lotions or creams on the incision until instructed by your surgeon.    ACTIVITY  o Increase activity slowly as tolerated, but follow the weight bearing instructions below.   o No  driving for 6 weeks or until further direction given by your physician.  You cannot drive while taking narcotics.  o No lifting or carrying greater than 10 lbs. until further directed by your surgeon. o Avoid periods of inactivity such as sitting longer than an hour when not asleep. This helps prevent blood clots.  o You may return to work once you are authorized by your doctor.     WEIGHT BEARING   Other:  weight bear as tolerated   EXERCISES  Results after joint replacement surgery are often greatly improved when you follow the exercise, range of motion and muscle strengthening exercises prescribed by your doctor. Safety measures are also important to protect the joint from further injury. Any time any of these exercises cause you to have increased pain or swelling, decrease what you are doing until you are comfortable again and then slowly increase them. If you have problems or questions, call your caregiver or physical therapist for advice.   Rehabilitation is important following a joint replacement. After just a few days of immobilization, the muscles of the leg can become weakened and shrink (atrophy).  These exercises are designed to build up the tone and strength of the thigh and leg muscles and to improve motion. Often times heat used for twenty to thirty minutes before working out will loosen up your tissues and help with improving the range of motion but do not use heat for  the first two weeks following surgery (sometimes heat can increase post-operative swelling).   These exercises can be done on a training (exercise) mat, on the floor, on a table or on a bed. Use whatever works the best and is most comfortable for you.    Use music or television while you are exercising so that the exercises are a pleasant break in your day. This will make your life better with the exercises acting as a break in your routine that you can look forward to.   Perform all exercises about fifteen times,  three times per day or as directed.  You should exercise both the operative leg and the other leg as well.  Exercises include:    Quad Sets - Tighten up the muscle on the front of the thigh (Quad) and hold for 5-10 seconds.    Straight Leg Raises - With your knee straight (if you were given a brace, keep it on), lift the leg to 60 degrees, hold for 3 seconds, and slowly lower the leg.  Perform this exercise against resistance later as your leg gets stronger.   Leg Slides: Lying on your back, slowly slide your foot toward your buttocks, bending your knee up off the floor (only go as far as is comfortable). Then slowly slide your foot back down until your leg is flat on the floor again.   Angel Wings: Lying on your back spread your legs to the side as far apart as you can without causing discomfort.   Hamstring Strength:  Lying on your back, push your heel against the floor with your leg straight by tightening up the muscles of your buttocks.  Repeat, but this time bend your knee to a comfortable angle, and push your heel against the floor.  You may put a pillow under the heel to make it more comfortable if necessary.   A rehabilitation program following joint replacement surgery can speed recovery and prevent re-injury in the future due to weakened muscles. Contact your doctor or a physical therapist for more information on knee rehabilitation.    CONSTIPATION  Constipation is defined medically as fewer than three stools per week and severe constipation as less than one stool per week.  Even if you have a regular bowel pattern at home, your normal regimen is likely to be disrupted due to multiple reasons following surgery.  Combination of anesthesia, postoperative narcotics, change in appetite and fluid intake all can affect your bowels.   YOU MUST use at least one of the following options; they are listed in order of increasing strength to get the job done.  They are all available over the  counter, and you may need to use some, POSSIBLY even all of these options:    Drink plenty of fluids (prune juice may be helpful) and high fiber foods Colace 100 mg by mouth twice a day  Senokot for constipation as directed and as needed Dulcolax (bisacodyl), take with full glass of water  Miralax (polyethylene glycol) once or twice a day as needed.  If you have tried all these things and are unable to have a bowel movement in the first 3-4 days after surgery call either your surgeon or your primary doctor.    If you experience loose stools or diarrhea, hold the medications until you stool forms back up.  If your symptoms do not get better within 1 week or if they get worse, check with your doctor.  If you experience "the worst abdominal  pain ever" or develop nausea or vomiting, please contact the office immediately for further recommendations for treatment.   ITCHING:  If you experience itching with your medications, try taking only a single pain pill, or even half a pain pill at a time.  You can also use Benadryl over the counter for itching or also to help with sleep.   TED HOSE STOCKINGS:  Use stockings on both legs until for at least 2 weeks or as directed by physician office. They may be removed at night for sleeping.  MEDICATIONS:  See your medication summary on the After Visit Summary that nursing will review with you.  You may have some home medications which will be placed on hold until you complete the course of blood thinner medication.  It is important for you to complete the blood thinner medication as prescribed.  PRECAUTIONS:  If you experience chest pain or shortness of breath - call 911 immediately for transfer to the hospital emergency department.   If you develop a fever greater that 101 F, purulent drainage from wound, increased redness or drainage from wound, foul odor from the wound/dressing, or calf pain - CONTACT YOUR SURGEON.                                                    FOLLOW-UP APPOINTMENTS:  If you do not already have a post-op appointment, please call the office for an appointment to be seen by your surgeon.  Guidelines for how soon to be seen are listed in your After Visit Summary, but are typically between 1-4 weeks after surgery.  OTHER INSTRUCTIONS:   Knee Replacement:  Do not place pillow under knee, focus on keeping the knee straight while resting. CPM instructions: 0-90 degrees, 2 hours in the morning, 2 hours in the afternoon, and 2 hours in the evening. Place foam block, curve side up under heel at all times except when in CPM or when walking.  DO NOT modify, tear, cut, or change the foam block in any way.  MAKE SURE YOU:   Understand these instructions.   Get help right away if you are not doing well or get worse.    Thank you for letting us be a part of your medical care team.  It is a privilege we respect greatly.  We hope these instructions will help you stay on track for a fast and full recovery!

## 2016-11-08 NOTE — H&P (View-Only) (Signed)
Brent Cook DOB: 1961-06-27 Married / Language: English / Race: White Male  H&P Date: 11/01/16  Chief Complaint: Left knee pain  History of Present Illness  The patient is a 56 year old male who comes in today for a preoperative History and Physical. The patient is scheduled for a left total knee arthroplasty to be performed by Dr. Johnn Hai, MD at [facility] on 11/08/2016. 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 He last saw Dr. Tonita Cong approximately 10 years ago, had a knee scope in 2007, had grade 3 changes of the medial compartment and patellofemoral at that point, no grade 4 changes; however, states Dr. Tonita Cong had told him that next time he was back, he would likely need a knee replacement. He reports progressively worsening symptoms. Knowing that he needed a knee replacement, he has not come back for a followup until now that this has gotten to the point where he feels he is ready to proceed with that. Reports that the knee sometimes jams on him meaning walking giving way and feels unstable. He is always conscious of it, does not trust it, difficulty with steps, very stiff after first getting up in the morning and after prolonged seating, and it does swell on him. He reports weightbearing pain. He is taking ibuprofen as needed, which does seem to help some. He has previously had injection therapy, both steroid and viscosupplementation, both of which did not give him any long-term relief. He denies history of DVT or MRSA. No known drug allergies. He is diabetic, on metformin only, no insulin. His last A1c was in the 6's.  Dr. Tonita Cong and the patient mutually agreed to proceed with a total knee replacement. Risks and benefits of the procedure were discussed including stiffness, suboptimal range of motion, persistent pain, infection requiring removal of prosthesis and reinsertion, need for prophylactic antibiotics in the future,  for example, dental procedures, possible need for manipulation, revision in the future and also anesthetic complications including DVT, PE, etc. We discussed the perioperative course, time in the hospital, postoperative recovery and the need for elevation to control swelling. We also discussed the predicted range of motion and the probability that squatting and kneeling would be unobtainable in the future. In addition, postoperative anticoagulation was discussed. We have obtained preoperative medical clearance as necessary. Provided illustrated handout and discussed it in detail. They will enroll in the total joint replacement educational forum at the hospital.  His [facility] pre-op appt is tomorrow.  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 Diabetes Mellitus  Mother. Heart Disease  Mother. Hypertension  Mother. First Degree Relatives   Social History  Tobacco use  Never smoker. 09/20/2016 Current work status  working full time Exercise  Exercises daily; does gym / [employer] Former drinker  09/20/2016: In the past drank Living situation  live with spouse, 2-3 steps to enter, one level set-up post-op Marital status  married No history of drug/alcohol rehab  Not under pain contract  Tobacco / smoke exposure  09/20/2016: no Post-Surgical Plans  home with HHPT  Medication History MetFORMIN HCl ER (500MG  Tablet ER 24HR, Oral) Active. Losartan Potassium (100MG  Tablet, Oral) Active. Cialis (Oral) Specific strength unknown - Active. Ibuprofen (Oral) Specific strength unknown -  Active. (prn) Medications Reconciled  Past Surgical History Arthroscopy of Knee  left  Review of Systems General Not Present- Chills, Fatigue, Fever, Memory Loss, Night Sweats, Weight Gain and Weight  Loss. Skin Not Present- Eczema, Hives, Itching, Lesions and Rash. HEENT Not Present- Dentures, Double Vision, Headache, Hearing Loss, Tinnitus and Visual Loss. Respiratory Not Present- Allergies, Chronic Cough, Coughing up blood, Shortness of breath at rest and Shortness of breath with exertion. Cardiovascular Not Present- Chest Pain, Difficulty Breathing Lying Down, Murmur, Palpitations, Racing/skipping heartbeats and Swelling. Gastrointestinal Not Present- Abdominal Pain, Bloody Stool, Constipation, Diarrhea, Difficulty Swallowing, Heartburn, Jaundice, Loss of appetitie, Nausea and Vomiting. Male Genitourinary Not Present- Blood in Urine, Discharge, Flank Pain, Incontinence, Painful Urination, Urgency, Urinary frequency, Urinary Retention, Urinating at Night and Weak urinary stream. Musculoskeletal Present- Joint Pain, Joint Swelling and Morning Stiffness. Not Present- Back Pain, Muscle Pain, Muscle Weakness and Spasms. Neurological Not Present- Blackout spells, Difficulty with balance, Dizziness, Paralysis, Tremor and Weakness. Psychiatric Not Present- Insomnia.  Vitals 11/01/2016 1:21 PM Weight: 206 lb Height: 67in Body Surface Area: 2.05 m Body Mass Index: 32.26 kg/m  BP: 140/84 (Sitting, Left Arm, Standard)  Physical Exam General Mental Status -Alert, cooperative and good historian. General Appearance-pleasant, Not in acute distress. Orientation-Oriented X3. Build & Nutrition-Well nourished and Well developed.  Head and Neck Head-normocephalic, atraumatic . Neck Global Assessment - supple, no bruit auscultated on the right, no bruit auscultated on the left.  Eye Pupil - Bilateral-Regular and Round. Motion - Bilateral-EOMI.  Chest and Lung Exam Auscultation Breath sounds - clear at anterior chest wall and clear at posterior chest wall. Adventitious sounds - No Adventitious sounds.  Cardiovascular Auscultation Rhythm - Regular rate and rhythm. Heart  Sounds - S1 WNL and S2 WNL. Murmurs & Other Heart Sounds - Auscultation of the heart reveals - No Murmurs.  Abdomen Palpation/Percussion Tenderness - Abdomen is non-tender to palpation. Rigidity (guarding) - Abdomen is soft. Auscultation Auscultation of the abdomen reveals - Bowel sounds normal.  Male Genitourinary Not done, not pertinent to present illness  Musculoskeletal On exam, he walks with an antalgic gait. Mood and affect is appropriate. He is tender in the medial joint line and has patellofemoral pain with compression. Trace effusion, 120 to -3. No DVT. Ipsilateral hip and ankle exam is unremarkable.  Imaging X-rays ordered, obtained, reviewed today by Dr. Tonita Cong. No fracture, subluxation, dislocation, lytic or blastic lesions. There is significant patellofemoral DJD, spurring in the patellofemoral compartment and the lateral compartment. Patella does appear to be tracking relatively midline. He does have some narrowing of that lateral compartment; however, there is not a full collapse. He does not have a varus or valgus deformity. The medial and lateral joint lines are relatively well preserved on today's x-ray.  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 Tonita Cong on 11/08/16. We again discussed the procedure itself as well as risks, complications and alternatives, including but not limited to DVT, PE, infx, bleeding, failure of procedure, need for secondary procedure including manipulation, nerve injury, ongoing pain/symptoms, anesthesia risk, even stroke or death. Also discussed typical post-op protocols, activity restrictions, need for PT, flexion/extension exercises, time out of work. Discussed need for DVT  ppx post-op per protocol. Discussed dental ppx and infx prevention. Also discussed limitations post-operatively such as kneeling and  squatting. All questions were answered. Patient desires to proceed with surgery as scheduled. Will hold supplements, ASA and NSAIDs accordingly. Will remain NPO after MN night before surgery. Will present to [facility] for pre-op testing. Anticipate hospital stay to include at least 2 midnights given medical history and to ensure proper pain control. Plan ASA 325mg  BID for DVT ppx post-op. Plan pain medication, Robaxin, Colace, Miralax. Plan home with HHPT post-op with family members at home for assistance. Will follow up 10-14 days post-op for staple removal and xrays.  Plan left total knee arthroplasty  Signed electronically by Cecilie Kicks, PA-C for Dr. Tonita Cong

## 2016-11-09 ENCOUNTER — Encounter (HOSPITAL_COMMUNITY): Payer: Self-pay | Admitting: Specialist

## 2016-11-09 LAB — CBC
HCT: 32.2 % — ABNORMAL LOW (ref 39.0–52.0)
Hemoglobin: 11.7 g/dL — ABNORMAL LOW (ref 13.0–17.0)
MCH: 31 pg (ref 26.0–34.0)
MCHC: 36.3 g/dL — AB (ref 30.0–36.0)
MCV: 85.4 fL (ref 78.0–100.0)
PLATELETS: 173 10*3/uL (ref 150–400)
RBC: 3.77 MIL/uL — ABNORMAL LOW (ref 4.22–5.81)
RDW: 12.9 % (ref 11.5–15.5)
WBC: 10.4 10*3/uL (ref 4.0–10.5)

## 2016-11-09 LAB — BASIC METABOLIC PANEL
Anion gap: 6 (ref 5–15)
BUN: 17 mg/dL (ref 6–20)
CO2: 28 mmol/L (ref 22–32)
CREATININE: 0.94 mg/dL (ref 0.61–1.24)
Calcium: 8.5 mg/dL — ABNORMAL LOW (ref 8.9–10.3)
Chloride: 102 mmol/L (ref 101–111)
GFR calc Af Amer: >60 mL/min (ref >60)
GLUCOSE: 237 mg/dL — AB (ref 65–99)
POTASSIUM: 3.5 mmol/L (ref 3.5–5.1)
SODIUM: 136 mmol/L (ref 135–145)

## 2016-11-09 LAB — GLUCOSE, CAPILLARY
GLUCOSE-CAPILLARY: 242 mg/dL — AB (ref 65–99)
GLUCOSE-CAPILLARY: 168 mg/dL — AB (ref 65–99)
Glucose-Capillary: 234 mg/dL — ABNORMAL HIGH (ref 65–99)
Glucose-Capillary: 206 mg/dL — ABNORMAL HIGH (ref 65–99)

## 2016-11-09 MED ORDER — CELECOXIB 200 MG PO CAPS
200.0000 mg | ORAL_CAPSULE | Freq: Two times a day (BID) | ORAL | Status: DC
  Administered 2016-11-09 – 2016-11-10 (×2): 200 mg via ORAL
  Filled 2016-11-09 (×2): qty 1

## 2016-11-09 MED ORDER — HYDROMORPHONE HCL 2 MG PO TABS
2.0000 mg | ORAL_TABLET | ORAL | Status: DC | PRN
  Administered 2016-11-09: 18:00:00 2 mg via ORAL
  Administered 2016-11-09 – 2016-11-10 (×5): 4 mg via ORAL
  Filled 2016-11-09 (×5): qty 2
  Filled 2016-11-09: qty 1

## 2016-11-09 NOTE — Care Management Note (Signed)
Case Management Note  Patient Details  Name: Brent Cook MRN: [id number] Date of Birth: 1961/07/10  Subjective/Objective: 56 y/o m admitted w/OA L Knee. POD@1  L TKA. From home. PT-recc HHPT,rw,3n1. Per [facility] as primary insurance-spoke to [place] CSW for [place] office(pager #[phone])-will need HHPT,f24f order faxed to Dr. Collins [place] office for auth-fax#[phone](faxed h&p,PT eval w/confirmation) she will forward request to Dr. Collins [place] CSW-Maggie Swchikey tel#[phone]. They will decide who they want to provide HHPT. [facility] rep Tim aware & following if needed. Patient agreed to [facility] for dme, & to bill his [facility] for the [place] delivered to patients rm already. Await HHPT order, & auth for HHPT-patient may have to d/c & hear back from [facility] office on his own for who they will give auth to- patient voiced understanding.                   Action/Plan:d/c home w/HHC)per [facility] auth/DME(already delivered to rm   Expected Discharge Date:                  Expected Discharge Plan:  [place]  In-House Referral:     Discharge planning Services  CM Consult  Post Acute Care Choice:    Choice offered to:  Patient  DME Arranged:  3-N-1, Walker rolling DME Agency:  [place]:    [place] Agency:     Status of Service:  In process, will continue to follow  If discussed at Long Length of Stay Meetings, dates discussed:    Additional Comments:  Dessa Phi, RN 11/09/2016, 1:58 PM

## 2016-11-09 NOTE — Progress Notes (Signed)
Physical Therapy Treatment Patient Details Name: Brent Cook MRN: [id number] DOB: 1961/01/28 Today's Date: 12/03/2016    History of Present Illness Pt s/p L TKR    PT Comments    Improvement in activity tolerance but pt continues ltd by c/o pain/fatigue.  Follow Up Recommendations  Home health PT     Equipment Recommendations  Rolling walker with 5" wheels    Recommendations for Other Services OT consult     Precautions / Restrictions Precautions Precautions: Fall Restrictions Weight Bearing Restrictions: No Other Position/Activity Restrictions: WBAT    Mobility  Bed Mobility Overal bed mobility: Needs Assistance Bed Mobility: Supine to Sit;Sit to Supine     Supine to sit: Min assist Sit to supine: Min assist   General bed mobility comments: cues for sequence and min assist for L LE management  Transfers Overall transfer level: Needs assistance Equipment used: Rolling walker (2 wheeled) Transfers: Sit to/from Stand Sit to Stand: Min guard         General transfer comment: cues for LE management and use of UEs to self assist  Ambulation/Gait Ambulation/Gait assistance: Min assist;Min guard Ambulation Distance (Feet): 58 Feet Assistive device: Rolling walker (2 wheeled) Gait Pattern/deviations: Step-to pattern;Decreased step length - right;Decreased step length - left;Shuffle;Trunk flexed Gait velocity: decr Gait velocity interpretation: Below normal speed for age/gender General Gait Details: cues for sequence, posture and position from [employer]            Wheelchair Mobility    Modified Rankin (Stroke Patients Only)       Balance                                    Cognition Arousal/Alertness: Awake/alert Behavior During Therapy: WFL for tasks assessed/performed Overall Cognitive Status: Within Functional Limits for tasks assessed                      Exercises      General Comments        Pertinent  Vitals/Pain Pain Assessment: 0-10 Pain Score: 5  Pain Location: L knee Pain Descriptors / Indicators: Aching Pain Intervention(s): Limited activity within patient's tolerance;Monitored during session;Premedicated before session;Ice applied    Home Living                      Prior Function            PT Goals (current goals can now be found in the care plan section) Acute Rehab PT Goals Patient Stated Goal: Regain IND PT Goal Formulation: With patient Time For Goal Achievement: 11/14/16 Potential to Achieve Goals: Good Progress towards PT goals: Progressing toward goals    Frequency    7X/week      PT Plan Current plan remains appropriate    Co-evaluation             End of Session Equipment Utilized During Treatment: Gait belt;Left knee immobilizer Activity Tolerance: Patient limited by fatigue;Patient limited by pain Patient left: in bed;with call bell/phone within reach     Time: 1411-1437 PT Time Calculation (min) (ACUTE ONLY): 26 min  Charges:  $Gait Training: 23-37 mins                    G Codes:      Molly Savarino December 03, 2016, 4:59 PM

## 2016-11-09 NOTE — Progress Notes (Signed)
RN paged on-call PA concerning patients uncontrolled pain throughout the day. Patient was reporting little to no relief from the oxycodone or muscle relaxer's. IV dilaudid seemed to be the only thing that helped. In an attempt to find something the patient could take orally for pain relief, nurse spoke with Mechele Claude, PA.   New orders given for:   200 mg celebrex BID and,   2-4 mg PO dilaudid q4h prn.

## 2016-11-09 NOTE — Progress Notes (Signed)
Subjective: 1 Day Post-Op Procedure(s) (LRB): LEFT TOTAL KNEE ARTHROPLASTY (Left) Patient reports pain as 4 on 0-10 scale.    Objective: Vital signs in last 24 hours: Temp:  [97.5 F (36.4 C)-98.2 F (36.8 C)] 97.9 F (36.6 C) (02/02 0512) Pulse Rate:  [71-92] 79 (02/02 0512) Resp:  [13-20] 18 (02/02 0512) BP: (111-136)/(57-92) 135/71 (02/02 0512) SpO2:  [94 %-100 %] 98 % (02/02 0512)  Intake/Output from previous day: 02/01 0701 - 02/02 0700 In: 4193.8 [P.O.:1320; I.V.:2873.8] Out: 3700 [Urine:3650; Blood:50] Intake/Output this shift: No intake/output data recorded.   Recent Labs  11/09/16 0423  HGB 11.7*    Recent Labs  11/09/16 0423  WBC 10.4  RBC 3.77*  HCT 32.2*  PLT 173    Recent Labs  11/09/16 0423  NA 136  K 3.5  CL 102  CO2 28  BUN 17  CREATININE 0.94  GLUCOSE 237*  CALCIUM 8.5*   No results for input(s): LABPT, INR in the last 72 hours.  Neurologically intact Sensation intact distally Intact pulses distally Compartment soft  Assessment/Plan: 1 Day Post-Op Procedure(s) (LRB): LEFT TOTAL KNEE ARTHROPLASTY (Left) Advance diet Up with therapy D/C IV fluids Plan for discharge tomorrow  Glucose eval.   Aviyana Sonntag C 11/09/2016, 7:39 AM

## 2016-11-09 NOTE — Progress Notes (Signed)
Physical Therapy Treatment Patient Details Name: Brent Cook MRN: [id number] DOB: 08/13/1961 Today's Date: December 08, 2016    History of Present Illness Pt s/p L TKR    PT Comments    Pt requiring ++encouragement for participation  Follow Up Recommendations  Home health PT     Equipment Recommendations  Rolling walker with 5" wheels    Recommendations for Other Services OT consult     Precautions / Restrictions Precautions Precautions: Fall Restrictions Weight Bearing Restrictions: No Other Position/Activity Restrictions: WBAT    Mobility  Bed Mobility         Supine to sit: Min assist Sit to supine: Min assist   General bed mobility comments: OOB deferred based on pt participation level with therex  Transfers   Equipment used: Rolling walker (2 wheeled)   Sit to Stand: Min guard         General transfer comment: cues for LE management and use of UEs to self assist  Ambulation/Gait                 Stairs            Wheelchair Mobility    Modified Rankin (Stroke Patients Only)       Balance                                    Cognition Arousal/Alertness: Awake/alert Behavior During Therapy: WFL for tasks assessed/performed Overall Cognitive Status: Within Functional Limits for tasks assessed                      Exercises Total Joint Exercises Ankle Circles/Pumps: AAROM;Both;15 reps;Supine Quad Sets: AAROM;Both;10 reps;Supine Heel Slides: AAROM;Left;10 reps;Supine Straight Leg Raises: AAROM;Left;10 reps;Supine Goniometric ROM: AAROM L knee -10 - 35 with +++encouragement    General Comments        Pertinent Vitals/Pain Pain Assessment: Faces Pain Score: 6  Faces Pain Scale: Hurts even more Pain Location: L knee with flex Pain Descriptors / Indicators: Aching Pain Intervention(s): Limited activity within patient's tolerance;Monitored during session;Premedicated before session;Ice applied    Home  Living Family/patient expects to be discharged to:: Private residence Living Arrangements: Parent Available Help at Discharge: Family         Home Equipment: None      Prior Function Level of Independence: Independent          PT Goals (current goals can now be found in the care plan section) Acute Rehab PT Goals Patient Stated Goal: Regain IND PT Goal Formulation: With patient Time For Goal Achievement: 11/14/16 Potential to Achieve Goals: Good Progress towards PT goals: Progressing toward goals    Frequency    7X/week      PT Plan Current plan remains appropriate    Co-evaluation             End of Session   Activity Tolerance: Patient limited by fatigue;Patient limited by pain;Patient limited by lethargy Patient left: in bed;with call bell/phone within reach     Time: 1046-1104 PT Time Calculation (min) (ACUTE ONLY): 18 min  Charges:  $Therapeutic Exercise: 8-22 mins                    G Codes:      Nija Koopman 12-08-2016, 12:12 PM

## 2016-11-09 NOTE — Care Management Note (Signed)
Case Management Note  Patient Details  Name: SCHAFER STUDER MRN: [id number] Date of Birth: 1961-04-02  Subjective/Objective: Spoke to Berkley Harvey ofice CSW-she has auth [facility] for HHPT-await HHPT,& [id number] order to fax to [place] [phone]-[facility] rep Tim aware of HHPT auth, await order,& d/c.                  Action/Plan:d/c home w/HHC/dme   Expected Discharge Date:                  Expected Discharge Plan:  [place]  In-House Referral:     Discharge planning Services  CM Consult  Post Acute Care Choice:    Choice offered to:  Patient  DME Arranged:  3-N-1, Walker rolling DME Agency:  [place]:  PT [place]:     Status of Service:  Completed, signed off  If discussed at [place] of Stay Meetings, dates discussed:    Additional Comments:  Dessa Phi, RN 11/09/2016, 2:26 PM

## 2016-11-09 NOTE — Evaluation (Signed)
Occupational Therapy Evaluation Patient Details Name: Brent Cook MRN: [id number] DOB: [birth year] Today's Date: 11/09/2016    History of Present Illness Pt s/p L TKR   Clinical Impression   This 56 year old man was admitted for the above sx.  He will benefit from one more OT session to further educate on bathroom transfers. Pt states that he will have ADL assistance as needed at home    Follow Up Recommendations  Supervision/Assistance - 24 hour    Equipment Recommendations  3 in 1 bedside commode    Recommendations for Other Services       Precautions / Restrictions Precautions Precautions: Fall Restrictions Weight Bearing Restrictions: No Other Position/Activity Restrictions: WBAT      Mobility Bed Mobility         Supine to sit: Min assist Sit to supine: Min assist   General bed mobility comments: assist for LLE  Transfers   Equipment used: Rolling walker (2 wheeled)   Sit to Stand: Min guard         General transfer comment: cues for LE management and use of UEs to self assist    Balance                                            ADL Overall ADL's : Needs assistance/impaired     Grooming: Supervision/safety;Standing       Lower Body Bathing: Moderate assistance;Sit to/from stand       Lower Body Dressing: Moderate assistance;Sit to/from stand   Toilet Transfer: Min guard;Ambulation;BSC;RW   Toileting- Water quality scientist and Hygiene: Min guard;Sit to/from stand         General ADL Comments: Pt rates pain 6-7 and says it is the same as yesterday. Pt will have assistance for adls at home.  Performed toilet transfer and educated on 3:1 commode     Vision     Perception     Praxis      Pertinent Vitals/Pain Pain Score: 6  Pain Location: L knee Pain Descriptors / Indicators: Aching Pain Intervention(s): Limited activity within patient's tolerance;Premedicated before session;Repositioned;Patient  requesting pain meds-RN notified;Ice applied     Hand Dominance     Extremity/Trunk Assessment Upper Extremity Assessment Upper Extremity Assessment: Overall WFL for tasks assessed           Communication Communication Communication: No difficulties   Cognition Arousal/Alertness: Awake/alert Behavior During Therapy: WFL for tasks assessed/performed Overall Cognitive Status: Within Functional Limits for tasks assessed                     General Comments       Exercises       Shoulder Instructions      Home Living Family/patient expects to be discharged to:: Private residence Living Arrangements: Parent Available Help at Discharge: Family               Bathroom Shower/Tub: Walk-in Psychologist, prison and probation services: Standard     Home Equipment: None          Prior Functioning/Environment Level of Independence: Independent                 OT Problem List: Decreased strength;Decreased activity tolerance;Pain;Decreased knowledge of use of DME or AE   OT Treatment/Interventions: Self-care/ADL training;DME and/or AE instruction;Patient/family education    OT Goals(Current goals can be  found in the care plan section) Acute Rehab OT Goals Patient Stated Goal: Regain IND OT Goal Formulation: With patient Time For Goal Achievement: 11/12/16 Potential to Achieve Goals: Good ADL Goals Pt Will Transfer to Toilet: with supervision;bedside commode;ambulating Pt Will Perform Tub/Shower Transfer: Shower transfer;with min guard assist;ambulating;3 in 1  OT Frequency: Min 2X/week   Barriers to D/C:            Co-evaluation              End of Session    Activity Tolerance: Patient limited by fatigue;Patient limited by pain Patient left: in bed;with call bell/phone within reach;with bed alarm set   Time: [id number] OT Time Calculation (min): 19 min Charges:  OT General Charges $OT Visit: 1 Procedure OT Evaluation $OT Eval Low Complexity: 1  Procedure G-Codes:    Rosmary Dionisio 11-25-16, 11:23 AM Lesle Chris, OTR/L [phone] 11-25-16

## 2016-11-10 LAB — CBC
HCT: 30.8 % — ABNORMAL LOW (ref 39.0–52.0)
HEMOGLOBIN: 11.3 g/dL — AB (ref 13.0–17.0)
MCH: 32.2 pg (ref 26.0–34.0)
MCHC: 36.7 g/dL — ABNORMAL HIGH (ref 30.0–36.0)
MCV: 87.7 fL (ref 78.0–100.0)
Platelets: 151 10*3/uL (ref 150–400)
RBC: 3.51 MIL/uL — AB (ref 4.22–5.81)
RDW: 13 % (ref 11.5–15.5)
WBC: 8.6 10*3/uL (ref 4.0–10.5)

## 2016-11-10 LAB — GLUCOSE, CAPILLARY
GLUCOSE-CAPILLARY: 226 mg/dL — AB (ref 65–99)
GLUCOSE-CAPILLARY: 264 mg/dL — AB (ref 65–99)

## 2016-11-10 MED ORDER — DOCUSATE SODIUM 100 MG PO CAPS: 100.0000 mg | ORAL_CAPSULE | Freq: Two times a day (BID) | ORAL | 1 refills | Status: DC | PRN

## 2016-11-10 MED ORDER — HYDROMORPHONE HCL 2 MG PO TABS: 2.0000 mg | ORAL_TABLET | ORAL | 0 refills | Status: DC | PRN

## 2016-11-10 MED ORDER — CELECOXIB 200 MG PO CAPS: 200.0000 mg | ORAL_CAPSULE | Freq: Two times a day (BID) | ORAL | 0 refills | Status: DC

## 2016-11-10 NOTE — Progress Notes (Signed)
     Subjective: 2 Days Post-Op Procedure(s) (LRB): LEFT TOTAL KNEE ARTHROPLASTY (Left)   Patient reports pain as mild, pain controlled. No events throughout night. Feels that his pain is much better controlled. Working well with PT.  Ready to be discharged home.    Objective:   VITALS:   Vitals:   11/10/16 0601 11/10/16 1419  BP: (!) 159/79 (!) 147/82  Pulse: 96 92  Resp: 18 16  Temp: 98.3 F (36.8 C) 98.4 F (36.9 C)    Dorsiflexion/Plantar flexion intact Incision: dressing C/D/I No cellulitis present Compartment soft  LABS  Recent Labs  11/09/16 0423 11/10/16 0540  HGB 11.7* 11.3*  HCT 32.2* 30.8*  WBC 10.4 8.6  PLT 173 151     Recent Labs  11/09/16 0423  NA 136  K 3.5  BUN 17  CREATININE 0.94  GLUCOSE 237*     Assessment/Plan: 2 Days Post-Op Procedure(s) (LRB): LEFT TOTAL KNEE ARTHROPLASTY (Left) Up with therapy Discharge home Follow up in 2 weeks at [facility]. Follow up with Dr. Tonita Cong in 2 weeks.  Contact information:  [facility] [address] [place] Donne Robillard   PAC  11/10/2016, 4:22 PM

## 2016-11-10 NOTE — Progress Notes (Signed)
Occupational Therapy Treatment Patient Details Name: Brent Cook MRN: [id number] DOB: 07-04-1961 Today's Date: 11/10/2016    History of present illness Pt s/p L TKR   OT comments  All education completed this session  Follow Up Recommendations  Supervision/Assistance - 24 hour    Equipment Recommendations  3 in 1 bedside commode    Recommendations for Other Services      Precautions / Restrictions Precautions Precautions: Fall Restrictions Other Position/Activity Restrictions: WBAT       Mobility Bed Mobility         Supine to sit: Min assist Sit to supine: Min assist   General bed mobility comments: assist for LLE  Transfers   Equipment used: Rolling walker (2 wheeled)   Sit to Stand: Supervision              Balance                                   ADL                                   Tub/ Shower Transfer: Walk-in shower;Min guard;Ambulation;3 in 1     General ADL Comments: stood to urinate with min guard for safety. Cues for walker distance as he was stepping past wheels initially.  Simulated toilet transfer:  min guard (bed).  Reviewed KI and no pillow under the knee      Vision                     Perception     Praxis      Cognition   Behavior During Therapy: WFL for tasks assessed/performed Overall Cognitive Status: Within Functional Limits for tasks assessed                       Extremity/Trunk Assessment               Exercises     Shoulder Instructions       General Comments      Pertinent Vitals/ Pain       Pain Score: 3  Pain Location: L knee Pain Descriptors / Indicators: Aching Pain Intervention(s): Limited activity within patient's tolerance;Monitored during session;Premedicated before session;Repositioned;Ice applied  Home Living                                          Prior Functioning/Environment              Frequency            Progress Toward Goals  OT Goals(current goals can now be found in the care plan section)  Progress towards OT goals: Progressing toward goals (pt does not need continued OT)     Plan      Co-evaluation                 End of Session     Activity Tolerance Patient tolerated treatment well   Patient Left in bed;with call bell/phone within reach;with bed alarm set   Nurse Communication          Time: [phone] OT Time Calculation (min): 17 min  Charges: OT General Charges $OT Visit: 1 Procedure OT Treatments $  Self Care/Home Management : 8-22 mins  Keona Sheffler 11/10/2016, 8:26 AM   Lesle Chris, OTR/L [phone] 11/10/2016

## 2016-11-10 NOTE — Progress Notes (Signed)
Pt to d/c home with [facility]. DME (3n1 and RW) delivered to room prior to d/c. AVS reviewed and "My Chart" discussed with pt. Pt capable of verbalizing medications, signs and symptoms of infection, and follow-up appointments. Remains hemodynamically stable. No signs and symptoms of distress. Educated pt to return to ER in the case of SOB, dizziness, or chest pain.

## 2016-11-10 NOTE — Progress Notes (Signed)
Physical Therapy Treatment Patient Details Name: Brent Cook MRN: [id number] DOB: Nov 18, 1960 Today's Date: 11/10/2016    History of Present Illness Pt s/p L TKR    PT Comments    Pt needing repeated encouragement throughout session for activity. PT willing to mobilize but not following cues for gait progression. Reviewed and stressed need for knee extension and avoidance of maintaining flexed knee position, pt verbalized understanding but not correcting positioning. PT to continue to follow and attempt to progress as tolerated.   Follow Up Recommendations  Home health PT     Equipment Recommendations  Rolling walker with 5" wheels    Recommendations for Other Services       Precautions / Restrictions Precautions Precautions: Fall Restrictions Weight Bearing Restrictions: Yes LLE Weight Bearing: Weight bearing as tolerated Other Position/Activity Restrictions: WBAT    Mobility  Bed Mobility Overal bed mobility: Needs Assistance Bed Mobility: Supine to Sit     Supine to sit: Min assist   General bed mobility comments: assist needed to move LLE to EOB  Transfers Overall transfer level: Needs assistance Equipment used: Rolling walker (2 wheeled) Transfers: Sit to/from Stand Sit to Stand: Supervision         General transfer comment: cues for hand placement, encouraging use of LLE  Ambulation/Gait Ambulation/Gait assistance: Min guard;Supervision Ambulation Distance (Feet): 90 Feet Assistive device: Rolling walker (2 wheeled) Gait Pattern/deviations: Step-to pattern;Decreased stance time - left;Decreased weight shift to left Gait velocity: decreased   General Gait Details: cues provided for increased weightbearing, knee extension and heel-toe pattern - pt not altering chosen pattern despite cues   Financial trader Rankin (Stroke Patients Only)       Balance Overall balance assessment: Needs  assistance Sitting-balance support: No upper extremity supported Sitting balance-Leahy Scale: Good     Standing balance support: Bilateral upper extremity supported Standing balance-Leahy Scale: Poor Standing balance comment: using rw                    Cognition Arousal/Alertness: Awake/alert Behavior During Therapy: Flat affect Overall Cognitive Status: Within Functional Limits for tasks assessed                      Exercises Total Joint Exercises Ankle Circles/Pumps: AROM;Both;10 reps Quad Sets: Strengthening;Left;10 reps Heel Slides: AAROM;Left;10 reps (max assist) Straight Leg Raises: Strengthening;Left;10 reps (max assist) Goniometric ROM: 40 degrees knee flexion with guarded endfeel    General Comments        Pertinent Vitals/Pain Pain Assessment: 0-10 Pain Score: 4  Pain Location: lt knee Pain Descriptors / Indicators: Aching;Guarding;Grimacing Pain Intervention(s): Limited activity within patient's tolerance;Monitored during session;Ice applied    Home Living                      Prior Function            PT Goals (current goals can now be found in the care plan section) Acute Rehab PT Goals Patient Stated Goal: less pain PT Goal Formulation: With patient Time For Goal Achievement: 11/14/16 Potential to Achieve Goals: Good Progress towards PT goals: Progressing toward goals    Frequency    7X/week      PT Plan Current plan remains appropriate    Co-evaluation             End of Session Equipment Utilized During Treatment: Gait  belt Activity Tolerance: Patient tolerated treatment well Patient left: in chair;with call bell/phone within reach (in knee extension)     Time: [id number] PT Time Calculation (min) (ACUTE ONLY): 25 min  Charges:  $Gait Training: 8-22 mins $Therapeutic Exercise: 8-22 mins                    G Codes:      Cassell Clement, PT, CSCS Pager [phone] Office [phone]  11/10/2016, 9:50 AM

## 2016-11-10 NOTE — Progress Notes (Signed)
Physical Therapy Treatment Patient Details Name: Brent Cook MRN: [id number] DOB: Jul 18, 1961 Today's Date: 11/10/2016    History of Present Illness Pt s/p L TKR    PT Comments    Pt making gradual progress with mobility at this time, able to increase ambulation to 130 ft with rw. Repeatedly encouraging lt knee extension and pt verbalizing understanding. Pt confirms having family support upon D/C to home. PT to follow and progress as tolerated.   Follow Up Recommendations  Home health PT     Equipment Recommendations  Rolling walker with 5" wheels    Recommendations for Other Services       Precautions / Restrictions Precautions Precautions: Fall Restrictions Weight Bearing Restrictions: Yes LLE Weight Bearing: Weight bearing as tolerated    Mobility  Bed Mobility Overal bed mobility: Needs Assistance Bed Mobility: Sit to Supine       Sit to supine: Min assist   General bed mobility comments: assist needed with LLE into bed  Transfers Overall transfer level: Needs assistance Equipment used: Rolling walker (2 wheeled) Transfers: Sit to/from Stand Sit to Stand: Supervision         General transfer comment: good hand placement  Ambulation/Gait Ambulation/Gait assistance: Min guard;Supervision Ambulation Distance (Feet): 130 Feet Assistive device: Rolling walker (2 wheeled) Gait Pattern/deviations: Step-to pattern;Decreased step length - right;Decreased stance time - left;Decreased weight shift to left Gait velocity: decreased   General Gait Details: encouraging weightbearing and knee extension on LLE   Stairs Stairs: Yes   Stair Management: No rails;Backwards;With walker;Step to pattern Number of Stairs: 2 General stair comments: Pt reports that he only has 2 steps to get into his home. States feeling confident after session.   Wheelchair Mobility    Modified Rankin (Stroke Patients Only)       Balance Overall balance assessment: Needs  assistance Sitting-balance support: No upper extremity supported Sitting balance-Leahy Scale: Good     Standing balance support: During functional activity Standing balance-Leahy Scale: Fair Standing balance comment: able to stand without UE support                    Cognition Arousal/Alertness: Awake/alert Behavior During Therapy: Flat affect Overall Cognitive Status: Within Functional Limits for tasks assessed                      Exercises Total Joint Exercises Ankle Circles/Pumps: AROM;Both;10 reps Quad Sets: Strengthening;Left;10 reps Heel Slides: AAROM;Left;10 reps Straight Leg Raises: Strengthening;Left;10 reps    General Comments        Pertinent Vitals/Pain Pain Assessment: 0-10 Pain Score: 7  Pain Location: lt knee/quadriceps Pain Descriptors / Indicators: Aching Pain Intervention(s): Monitored during session;Limited activity within patient's tolerance;Ice applied    Home Living                      Prior Function            PT Goals (current goals can now be found in the care plan section) Acute Rehab PT Goals Patient Stated Goal: get to bed PT Goal Formulation: With patient Time For Goal Achievement: 11/14/16 Potential to Achieve Goals: Good Progress towards PT goals: Progressing toward goals    Frequency    7X/week      PT Plan Current plan remains appropriate    Co-evaluation             End of Session Equipment Utilized During Treatment: Gait belt Activity Tolerance: Patient tolerated  treatment well Patient left: in bed;with call bell/phone within reach;with SCD's reapplied     Time: [id number] PT Time Calculation (min) (ACUTE ONLY): 31 min  Charges:  $Gait Training: 8-22 mins $Therapeutic Exercise: 8-22 mins                    G Codes:      Linard Millers 12-Nov-2016, 4:16 PM

## 2016-11-10 NOTE — Care Management Note (Signed)
Case Management Note  Patient Details  Name: Brent Cook MRN: [id number] Date of Birth: Nov 30, 1960  Subjective/Objective:  Left TKA                   Action/Plan: please see previous NCM notes Discharge Planning: AVS reviewed NCM spoke to pt and RW and 3n1 delivered to room. Faxed dc summary, and orders to Dr Collins [place] at [facility].   Expected Discharge Date:  11/10/16               Expected Discharge Plan:  [place]  In-House Referral:  NA  Discharge planning Services  CM Consult  Post Acute Care Choice:  Home Health Choice offered to:  Patient  DME Arranged:  3-N-1, Walker rolling DME Agency:  [place]:  PT [place] Agency:  [facility] ([facility])  Status of Service:  Completed, signed off  If discussed at [employer], dates discussed:    Additional Comments:  Erenest Rasher, RN 11/10/2016, 5:09 PM

## 2016-11-11 DIAGNOSIS — I10 Essential (primary) hypertension: Secondary | ICD-10-CM | POA: Diagnosis not present

## 2016-11-11 DIAGNOSIS — Z471 Aftercare following joint replacement surgery: Secondary | ICD-10-CM | POA: Diagnosis not present

## 2016-11-11 DIAGNOSIS — E119 Type 2 diabetes mellitus without complications: Secondary | ICD-10-CM | POA: Diagnosis not present

## 2016-11-13 NOTE — Discharge Summary (Signed)
Physician Discharge Summary   Patient ID: [name]: [id number] DOB/AGE: 05-30-1961 56 y.o.  Admit date: 11/08/2016 Discharge date: 11/10/2016  Primary Diagnosis: left knee post-traumatic osteoarthritis  Admission Diagnoses:  Past Medical History:  Diagnosis Date  . Arthritis    left knee  . Asthma    as child  . Diabetes mellitus    type 2  . Erectile dysfunction   . Hypertension    Discharge Diagnoses:   Principal Problem:   Post-traumatic osteoarthritis of left knee Active Problems:   Left knee DJD  Estimated body mass index is 32.11 kg/m as calculated from the following:   Height as of this encounter: 5' 7" (1.702 m).   Weight as of this encounter: 93 kg (205 lb).  Procedure:  Procedure(s) (LRB): LEFT TOTAL KNEE ARTHROPLASTY (Left)   Consults: None  HPI: see H&P Laboratory Data: Admission on 11/08/2016, Discharged on 11/10/2016  Component Date Value Ref Range Status  . Glucose-Capillary 11/08/2016 142* 65 - 99 mg/dL Final  . Glucose-Capillary 11/08/2016 165* 65 - 99 mg/dL Final  . Glucose-Capillary 11/08/2016 274* 65 - 99 mg/dL Final  . Glucose-Capillary 11/08/2016 324* 65 - 99 mg/dL Final  . WBC 11/09/2016 10.4  4.0 - 10.5 K/uL Final  . RBC 11/09/2016 3.77* 4.22 - 5.81 MIL/uL Final  . Hemoglobin 11/09/2016 11.7* 13.0 - 17.0 g/dL Final  . HCT 11/09/2016 32.2* 39.0 - 52.0 % Final  . MCV 11/09/2016 85.4  78.0 - 100.0 fL Final  . MCH 11/09/2016 31.0  26.0 - 34.0 pg Final  . MCHC 11/09/2016 36.3* 30.0 - 36.0 g/dL Final  . RDW 11/09/2016 12.9  11.5 - 15.5 % Final  . Platelets 11/09/2016 173  150 - 400 K/uL Final  . Sodium 11/09/2016 136  135 - 145 mmol/L Final  . Potassium 11/09/2016 3.5  3.5 - 5.1 mmol/L Final  . Chloride 11/09/2016 102  101 - 111 mmol/L Final  . CO2 11/09/2016 28  22 - 32 mmol/L Final  . Glucose, Bld 11/09/2016 237* 65 - 99 mg/dL Final  . BUN 11/09/2016 17  6 - 20 mg/dL Final  . Creatinine, Ser 11/09/2016 0.94  0.61 - 1.24 mg/dL  Final  . Calcium 11/09/2016 8.5* 8.9 - 10.3 mg/dL Final  . GFR calc non Af Amer 11/09/2016 >60  >60 mL/min Final  . GFR calc Af Amer 11/09/2016 >60  >60 mL/min Final   Comment: (NOTE) The eGFR has been calculated using the CKD EPI equation. This calculation has not been validated in all clinical situations. eGFR's persistently <60 mL/min signify possible Chronic Kidney Disease.   . Anion gap 11/09/2016 6  5 - 15 Final  . Glucose-Capillary 11/08/2016 218* 65 - 99 mg/dL Final  . Glucose-Capillary 11/09/2016 242* 65 - 99 mg/dL Final  . Glucose-Capillary 11/09/2016 234* 65 - 99 mg/dL Final  . Glucose-Capillary 11/09/2016 206* 65 - 99 mg/dL Final  . WBC 11/10/2016 8.6  4.0 - 10.5 K/uL Final  . RBC 11/10/2016 3.51* 4.22 - 5.81 MIL/uL Final  . Hemoglobin 11/10/2016 11.3* 13.0 - 17.0 g/dL Final  . HCT 11/10/2016 30.8* 39.0 - 52.0 % Final  . MCV 11/10/2016 87.7  78.0 - 100.0 fL Final  . MCH 11/10/2016 32.2  26.0 - 34.0 pg Final  . MCHC 11/10/2016 36.7* 30.0 - 36.0 g/dL Final  . RDW 11/10/2016 13.0  11.5 - 15.5 % Final  . Platelets 11/10/2016 151  150 - 400 K/uL Final  . Glucose-Capillary 11/09/2016 168* 65 -  99 mg/dL Final  . Glucose-Capillary 11/10/2016 226* 65 - 99 mg/dL Final  . Glucose-Capillary 11/10/2016 264* 65 - 99 mg/dL Final  Hospital Outpatient Visit on 11/02/2016  Component Date Value Ref Range Status  . Hgb A1c MFr Bld 11/02/2016 6.1* 4.8 - 5.6 % Final   Comment: (NOTE)         Pre-diabetes: 5.7 - 6.4         Diabetes: >6.4         Glycemic control for adults with diabetes: <7.0   . Mean Plasma Glucose 11/02/2016 128  mg/dL Final   Comment: (NOTE) Performed At: [facility] [place], [place] [id number] Lindon Romp MD PP:[id number]   . aPTT 11/02/2016 32  24 - 36 seconds Final  . Sodium 11/02/2016 138  135 - 145 mmol/L Final  . Potassium 11/02/2016 3.5  3.5 - 5.1 mmol/L Final  . Chloride 11/02/2016 102  101 - 111 mmol/L Final  . CO2  11/02/2016 30  22 - 32 mmol/L Final  . Glucose, Bld 11/02/2016 118* 65 - 99 mg/dL Final  . BUN 11/02/2016 16  6 - 20 mg/dL Final  . Creatinine, Ser 11/02/2016 0.97  0.61 - 1.24 mg/dL Final  . Calcium 11/02/2016 9.4  8.9 - 10.3 mg/dL Final  . GFR calc non Af Amer 11/02/2016 >60  >60 mL/min Final  . GFR calc Af Amer 11/02/2016 >60  >60 mL/min Final   Comment: (NOTE) The eGFR has been calculated using the CKD EPI equation. This calculation has not been validated in all clinical situations. eGFR's persistently <60 mL/min signify possible Chronic Kidney Disease.   . Anion gap 11/02/2016 6  5 - 15 Final  . WBC 11/02/2016 3.9* 4.0 - 10.5 K/uL Final  . RBC 11/02/2016 4.19* 4.22 - 5.81 MIL/uL Final  . Hemoglobin 11/02/2016 13.4  13.0 - 17.0 g/dL Final  . HCT 11/02/2016 37.0* 39.0 - 52.0 % Final  . MCV 11/02/2016 88.3  78.0 - 100.0 fL Final  . MCH 11/02/2016 32.0  26.0 - 34.0 pg Final  . MCHC 11/02/2016 36.2* 30.0 - 36.0 g/dL Final  . RDW 11/02/2016 13.3  11.5 - 15.5 % Final  . Platelets 11/02/2016 196  150 - 400 K/uL Final  . Prothrombin Time 11/02/2016 13.7  11.4 - 15.2 seconds Final  . INR 11/02/2016 1.04   Final  . ABO/RH(D) 11/02/2016 O NEG   Final  . Antibody Screen 11/02/2016 NEG   Final  . Sample Expiration 11/02/2016 11/11/2016   Final  . Extend sample reason 11/02/2016 NO TRANSFUSIONS OR PREGNANCY IN THE PAST 3 MONTHS   Final  . Color, Urine 11/02/2016 YELLOW  YELLOW Final  . APPearance 11/02/2016 CLEAR  CLEAR Final  . Specific Gravity, Urine 11/02/2016 1.008  1.005 - 1.030 Final  . pH 11/02/2016 6.0  5.0 - 8.0 Final  . Glucose, UA 11/02/2016 NEGATIVE  NEGATIVE mg/dL Final  . Hgb urine dipstick 11/02/2016 SMALL* NEGATIVE Final  . Bilirubin Urine 11/02/2016 NEGATIVE  NEGATIVE Final  . Ketones, ur 11/02/2016 NEGATIVE  NEGATIVE mg/dL Final  . Protein, ur 11/02/2016 NEGATIVE  NEGATIVE mg/dL Final  . Nitrite 11/02/2016 NEGATIVE  NEGATIVE Final  . Leukocytes, UA 11/02/2016 NEGATIVE   NEGATIVE Final  . RBC / HPF 11/02/2016 0-5  0 - 5 RBC/hpf Final  . WBC, UA 11/02/2016 0-5  0 - 5 WBC/hpf Final  . Bacteria, UA 11/02/2016 NONE SEEN  NONE SEEN Final  . Squamous Epithelial / LPF  11/02/2016 NONE SEEN  NONE SEEN Final  . MRSA, PCR 11/02/2016 NEGATIVE  NEGATIVE Final  . Staphylococcus aureus 11/02/2016 NEGATIVE  NEGATIVE Final   Comment:        The Xpert SA Assay (FDA approved for NASAL specimens in patients over 74 years of age), is one component of a comprehensive surveillance program.  Test performance has been validated by [facility] for patients greater than or equal to 32 year old. It is not intended to diagnose infection nor to guide or monitor treatment.   . Glucose-Capillary 11/02/2016 108* 65 - 99 mg/dL Final  . ABO/RH(D) 11/02/2016 O NEG   Final     X-Rays:Dg Knee Left Port  Result Date: 11/08/2016 CLINICAL DATA:  Knee replacement. EXAM: PORTABLE LEFT KNEE - 1-2 VIEW COMPARISON:  No prior. FINDINGS: Total left knee replacement.  Hardware intact.  Anatomic alignment. 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 Electronically Signed   By: Marcello Moores  Register   On: 11/08/2016 12:25    EKG: Orders placed or performed during the hospital encounter of 11/02/16  . EKG 12 lead  . EKG 12 lead     Hospital Course: Brent Cook is a 56 y.o. who was admitted to [facility]. They were brought to the operating room on 11/08/2016 and underwent Procedure(s): LEFT TOTAL KNEE ARTHROPLASTY.  Patient tolerated the procedure well and was later transferred to the recovery room and then to the orthopaedic floor for postoperative care.  They were given PO and IV analgesics for pain control following their surgery.  They were given 24 hours of postoperative antibiotics of  Anti-infectives    Start     Dose/Rate Route Frequency Ordered Stop   11/08/16 1200  ceFAZolin (ANCEF) IVPB 2g/100 mL premix     2 g 200 mL/hr over 30 Minutes Intravenous  Every 6 hours 11/08/16 1146 11/09/16 0034   11/08/16 0759  polymyxin B 500,000 Units, bacitracin 50,000 Units in sodium chloride irrigation 0.9 % 500 mL irrigation  Status:  Discontinued       As needed 11/08/16 0759 11/08/16 0953   11/08/16 0600  ceFAZolin (ANCEF) IVPB 2g/100 mL premix     2 g 200 mL/hr over 30 Minutes Intravenous On call to O.R. 11/08/16 6812 11/08/16 0730     and started on DVT prophylaxis in the form of Aspirin, TED hose and SCDs.   PT and OT were ordered for total joint protocol.  Discharge planning consulted to help with postop disposition and equipment needs.  Patient had a good night on the evening of surgery.  They started to get up OOB with therapy on day one. Continued to work with therapy into day two. By day two post-op, the patient had progressed with therapy and meeting their goals.  Incision was healing well.  Patient was seen in rounds and was ready to go home.   Diet: Regular diet Activity:WBAT Follow-up:in 10-14 days Disposition - Home Discharged Condition: good    Allergies as of 11/10/2016   No Known Allergies     Medication List    STOP taking these medications   atorvastatin 40 MG tablet Commonly known as:  LIPITOR     TAKE these medications   aspirin EC 325 MG tablet Take 1 tablet (325 mg total) by mouth 2 (two) times daily after a meal.   celecoxib 200 MG capsule Commonly known as:  CELEBREX Take 1 capsule (200 mg total) by mouth every 12 (twelve) hours.  CIALIS 20 MG tablet Generic drug:  tadalafil Take 1 tablet (20 mg total) by mouth daily as needed for erectile dysfunction.   docusate sodium 100 MG capsule Commonly known as:  COLACE Take 1 capsule (100 mg total) by mouth 2 (two) times daily as needed for mild constipation.   hydrochlorothiazide 25 MG tablet Commonly known as:  HYDRODIURIL Take 37.5 mg by mouth at bedtime.   HYDROmorphone 2 MG tablet Commonly known as:  DILAUDID Take 1-2 tablets (2-4 mg total) by mouth  every 4 (four) hours as needed for severe pain.   losartan 100 MG tablet Commonly known as:  COZAAR Take 50 mg by mouth at bedtime.   metFORMIN 500 MG 24 hr tablet Commonly known as:  GLUCOPHAGE-XR Take 1,000 mg by mouth at bedtime.   methocarbamol 500 MG tablet Commonly known as:  ROBAXIN Take 1 tablet (500 mg total) by mouth every 6 (six) hours as needed for muscle spasms.   polyethylene glycol packet Commonly known as:  MIRALAX / GLYCOLAX Take 17 g by mouth daily.      Follow-up Information    BEANE,JEFFREY C, MD Follow up in 2 week(s).   Specialty:  Orthopedic Surgery Contact information: [address] [place] [address] [phone]        [facility] Follow up.   Why:  rolling walker, bedside commode Contact information: [place] [address] [phone]        [facility] Follow up.   Specialty:  [place] Why:  [facility] physical therapy Contact information: [place] [address] [place] [address] [phone]           Signed: Lacie Draft, PA-C Orthopaedic Surgery 11/13/2016, 11:06 AM

## 2016-11-14 DIAGNOSIS — E119 Type 2 diabetes mellitus without complications: Secondary | ICD-10-CM | POA: Diagnosis not present

## 2016-11-14 DIAGNOSIS — I10 Essential (primary) hypertension: Secondary | ICD-10-CM | POA: Diagnosis not present

## 2016-11-14 DIAGNOSIS — Z471 Aftercare following joint replacement surgery: Secondary | ICD-10-CM | POA: Diagnosis not present

## 2016-11-15 DIAGNOSIS — Z471 Aftercare following joint replacement surgery: Secondary | ICD-10-CM | POA: Diagnosis not present

## 2016-11-15 DIAGNOSIS — E119 Type 2 diabetes mellitus without complications: Secondary | ICD-10-CM | POA: Diagnosis not present

## 2016-11-15 DIAGNOSIS — I10 Essential (primary) hypertension: Secondary | ICD-10-CM | POA: Diagnosis not present

## 2016-11-19 DIAGNOSIS — E119 Type 2 diabetes mellitus without complications: Secondary | ICD-10-CM | POA: Diagnosis not present

## 2016-11-19 DIAGNOSIS — I10 Essential (primary) hypertension: Secondary | ICD-10-CM | POA: Diagnosis not present

## 2016-11-19 DIAGNOSIS — Z471 Aftercare following joint replacement surgery: Secondary | ICD-10-CM | POA: Diagnosis not present

## 2016-11-21 DIAGNOSIS — E119 Type 2 diabetes mellitus without complications: Secondary | ICD-10-CM | POA: Diagnosis not present

## 2016-11-21 DIAGNOSIS — Z471 Aftercare following joint replacement surgery: Secondary | ICD-10-CM | POA: Diagnosis not present

## 2016-11-21 DIAGNOSIS — I10 Essential (primary) hypertension: Secondary | ICD-10-CM | POA: Diagnosis not present

## 2016-11-26 DIAGNOSIS — M1732 Unilateral post-traumatic osteoarthritis, left knee: Secondary | ICD-10-CM | POA: Diagnosis not present

## 2016-11-29 ENCOUNTER — Other Ambulatory Visit: Payer: Self-pay | Admitting: Physician Assistant

## 2016-11-29 DIAGNOSIS — M1732 Unilateral post-traumatic osteoarthritis, left knee: Secondary | ICD-10-CM | POA: Diagnosis not present

## 2016-12-03 DIAGNOSIS — M1732 Unilateral post-traumatic osteoarthritis, left knee: Secondary | ICD-10-CM | POA: Diagnosis not present

## 2016-12-05 DIAGNOSIS — M1732 Unilateral post-traumatic osteoarthritis, left knee: Secondary | ICD-10-CM | POA: Diagnosis not present

## 2016-12-09 NOTE — Assessment & Plan Note (Signed)
Cialis refilled. Further refills to come from [place].

## 2016-12-09 NOTE — Assessment & Plan Note (Signed)
Managed by [facility] clinic. Patient to continue care per there instructions. Will need to get refills from them. Medication list updated. Patient to bring copy of records from [place] to abstract into his chart, especially recent blood work.

## 2016-12-09 NOTE — Assessment & Plan Note (Addendum)
BP above goal today. Patient has not taken medications. Asymptomatic. Patient has been managed by [facility] clinic in [place]. Discussed that he would need to choose his primary care -- the provider who will be managing chronic issues. Patient elects to have [place] continue to manage chronic conditions but will continue with [place] for acute concerns. As such medication refills of chronic medications will not come from this office. Discussed compliance with medications and to schedule [facility] with [facility] for reassessment of BP

## 2016-12-10 DIAGNOSIS — M1732 Unilateral post-traumatic osteoarthritis, left knee: Secondary | ICD-10-CM | POA: Diagnosis not present

## 2016-12-12 DIAGNOSIS — M1732 Unilateral post-traumatic osteoarthritis, left knee: Secondary | ICD-10-CM | POA: Diagnosis not present

## 2016-12-20 ENCOUNTER — Other Ambulatory Visit: Payer: Self-pay | Admitting: Physician Assistant

## 2016-12-20 NOTE — Telephone Encounter (Signed)
Refill denied. He has been seeing [facility] who has been managing BP. Refills to come from them.

## 2016-12-21 ENCOUNTER — Other Ambulatory Visit: Payer: Self-pay | Admitting: Physician Assistant

## 2016-12-24 ENCOUNTER — Telehealth: Payer: Self-pay | Admitting: Physician Assistant

## 2016-12-24 NOTE — Telephone Encounter (Signed)
Pt asking for a refill on CIALIS, [facility] in [place]

## 2016-12-27 NOTE — Telephone Encounter (Signed)
LMOVM advising patient he would need to get his refill of the Cialis and other medications from the [facility] hospital who is drawing his labs. We are handling his acute problems. If he wants refill from our office we will need to do labs.

## 2017-02-28 DIAGNOSIS — Z96652 Presence of left artificial knee joint: Secondary | ICD-10-CM | POA: Diagnosis not present

## 2017-04-02 ENCOUNTER — Ambulatory Visit: Payer: 59 | Admitting: Adult Health

## 2017-04-03 ENCOUNTER — Telehealth: Payer: Self-pay | Admitting: Physician Assistant

## 2017-04-03 NOTE — Telephone Encounter (Signed)
Patient has been approved by both providers for transfer from Brent Cook in [place] to Dr Martinique.  Patient was called to make a transfer visit appt. Left a message for a call back.

## 2017-04-03 NOTE — Telephone Encounter (Signed)
Pt has been sch

## 2017-04-05 ENCOUNTER — Ambulatory Visit (INDEPENDENT_AMBULATORY_CARE_PROVIDER_SITE_OTHER): Payer: 59 | Admitting: Family Medicine

## 2017-04-05 ENCOUNTER — Encounter: Payer: Self-pay | Admitting: Family Medicine

## 2017-04-05 VITALS — BP 138/80 | HR 89 | Resp 12 | Ht 67.0 in | Wt 195.6 lb

## 2017-04-05 DIAGNOSIS — E119 Type 2 diabetes mellitus without complications: Secondary | ICD-10-CM | POA: Diagnosis not present

## 2017-04-05 DIAGNOSIS — I10 Essential (primary) hypertension: Secondary | ICD-10-CM

## 2017-04-05 DIAGNOSIS — N529 Male erectile dysfunction, unspecified: Secondary | ICD-10-CM | POA: Diagnosis not present

## 2017-04-05 LAB — BASIC METABOLIC PANEL
BUN: 17 mg/dL (ref 6–23)
CALCIUM: 9.4 mg/dL (ref 8.4–10.5)
CO2: 26 mEq/L (ref 19–32)
Chloride: 105 mEq/L (ref 96–112)
Creatinine, Ser: 0.93 mg/dL (ref 0.40–1.50)
GFR: 108.44 mL/min (ref >60.00)
GLUCOSE: 147 mg/dL — AB (ref 70–99)
Potassium: 3.7 mEq/L (ref 3.5–5.1)
SODIUM: 140 meq/L (ref 135–145)

## 2017-04-05 LAB — HEMOGLOBIN A1C: Hgb A1c MFr Bld: 5.1 % (ref 4.6–6.5)

## 2017-04-05 LAB — MICROALBUMIN / CREATININE URINE RATIO
CREATININE, U: 169.4 mg/dL
MICROALB UR: 3.1 mg/dL — AB (ref 0.0–1.9)
Microalb Creat Ratio: 1.8 mg/g (ref 0.0–30.0)

## 2017-04-05 MED ORDER — OLMESARTAN MEDOXOMIL-HCTZ 20-12.5 MG PO TABS: 1.0000 | ORAL_TABLET | Freq: Every day | ORAL | 1 refills | Status: DC

## 2017-04-05 MED ORDER — CIALIS 20 MG PO TABS: 20.0000 mg | ORAL_TABLET | Freq: Every day | ORAL | 3 refills | Status: DC | PRN

## 2017-04-05 NOTE — Patient Instructions (Signed)
A few things to remember from today's visit:   Essential hypertension - Plan: olmesartan-hydrochlorothiazide (BENICAR HCT) 20-12.5 MG tablet, Basic metabolic panel  Controlled type 2 diabetes mellitus without complication, without long-term current use of insulin (HCC) - Plan: Hemoglobin A1c, Fructosamine, Microalbumin / creatinine urine ratio  Erectile dysfunction, unspecified erectile dysfunction type - Plan: CIALIS 20 MG tablet  Blood pressure goal for most people is less than 140/90.   Most recent cardiologists' recommendations recommend blood pressure at or less than 130/80.   Elevated blood pressure increases the risk of strokes, heart and kidney disease, and eye problems. Regular physical activity and a healthy diet (DASH diet) usually help. Low salt diet. Take medications as instructed.  Caution with some over the counter medications as cold medications, dietary products (for weight loss), and Ibuprofen or Aleve (frequent use);all these medications could cause elevation of blood pressure.  HgA1C goal < 7.0. Avoid sugar added food:regular soft drinks, energy drinks, and sports drinks. candy. cakes. cookies. pies and cobblers. sweet rolls, pastries, and donuts. fruit drinks, such as fruitades and fruit punch. dairy desserts, such as ice cream  Mediterranean diet has showed benefits for sugar control.  How much and what type of carbohydrate foods are important for managing diabetes. The balance between how much insulin is in your body and the carbohydrate you eat makes a difference in your blood glucose levels.  Fasting blood sugar ideally 130 or less, 2 hours after meals less than 180.   Regular exercise also will help with controlling disease, daily brisk walking as tolerated for 15-30 min definitively will help.   Avoid skipping meals, blood sugar might drop and cause serious problems. Remember checking feet periodically, good dental hygiene, and annual eye  exam.      Please be sure medication list is accurate. If a new problem present, please set up appointment sooner than planned today.

## 2017-04-05 NOTE — Progress Notes (Signed)
HPI:   [name] is a 56 y.o. male, who is here today to establish care.  Former PCP: Mr Brent Cook, [place] Last preventive routine visit: within the past year.   Chronic medical problems: DM II, HTN.HLD, and OA among some.   Concerns today: Med refills  Hypertension:   Dx 10 years ago  Currently on Benicar HCT 20-12.5 mg daily. He does not check BP's. Last eye exam > 1 year ago.  He is taking medications as instructed, no side effects reported.  He has not noted unusual headache, visual changes, exertional chest pain, dyspnea,  focal weakness, or edema.   Lab Results  Component Value Date   CREATININE 0.94 11/09/2016   BUN 17 11/09/2016   NA 136 11/09/2016   K 3.5 11/09/2016   CL 102 11/09/2016   CO2 28 11/09/2016   Diabetes Mellitus II:  Dx 15 years ago.  Last eye exam 10/2015. Currently on Glucophage XL 500 mg 2 tabs daily.  . Checking BS's : 130's-140's Hypoglycemia:Denies  He is tolerating medications well. He denies abdominal pain, nausea, vomiting, polydipsia, polyuria, or polyphagia. Denies numbness, tingling, or burning.     Lab Results  Component Value Date   HGBA1C 6.1 (H) 11/02/2016   Lab Results  Component Value Date   MICROALBUR 0.3 05/28/2014   He follows a healthy diet, has not exercise regularly as he did before because left knee got worse and knee surgery , s/p TKR in 11/2016. He is following with ortho,Dr Brent Cook.Planning on resuming PT program to improve ROM.  He works 3rd shift and has Cook so for about 7-8 years, he is now starting 1st shift after holidays.  ED: Requesting refills for Cialis 20 mg, which he has been on for about 1-2 years. Medication helps with problem.  Denies dysuria,increased urinary frequency, gross hematuria,or decreased urine output.   Review of Systems  Constitutional: Negative for activity change, appetite change, fatigue and fever.  HENT: Negative for nosebleeds, sore throat and trouble  swallowing.   Eyes: Negative for redness and visual disturbance.  Respiratory: Negative for cough, shortness of breath and wheezing.   Cardiovascular: Negative for chest pain, palpitations and leg swelling.  Gastrointestinal: Negative for abdominal pain, nausea and vomiting.  Endocrine: Negative for polydipsia, polyphagia and polyuria.  Genitourinary: Negative for decreased urine volume, dysuria and hematuria.  Musculoskeletal: Positive for arthralgias and gait problem.  Skin: Negative for rash and wound.  Neurological: Negative for syncope, weakness, numbness and headaches.  Psychiatric/Behavioral: Negative for confusion. The patient is not nervous/anxious.       Current Outpatient Prescriptions on File Prior to Visit  Medication Sig Dispense Refill  . aspirin EC 325 MG tablet Take 1 tablet (325 mg total) by mouth 2 (two) times daily after a meal. 60 tablet 1  . HYDROmorphone (DILAUDID) 2 MG tablet Take 1-2 tablets (2-4 mg total) by mouth every 4 (four) hours as needed for severe pain. 60 tablet 0  . metFORMIN (GLUCOPHAGE-XR) 500 MG 24 hr tablet Take 1,000 mg by mouth at bedtime.    . methocarbamol (ROBAXIN) 500 MG tablet Take 1 tablet (500 mg total) by mouth every 6 (six) hours as needed for muscle spasms. 40 tablet 1  . polyethylene glycol (MIRALAX / GLYCOLAX) packet Take 17 g by mouth daily. 14 each 0   No current facility-administered medications on file prior to visit.      Past Medical History:  Diagnosis Date  . Arthritis  left knee  . Asthma    as child  . Diabetes mellitus    type 2  . Erectile dysfunction   . Hypertension    No Known Allergies  Family History  Problem Relation Age of Onset  . Cancer Father   . Colon cancer Father        dx'd late 33's early 35's  . Cancer Mother   . Cancer Maternal Grandfather   . Colon polyps Neg Hx   . Rectal cancer Neg Hx   . Stomach cancer Neg Hx     Social History   Social History  . Marital status: Married     Spouse name: N/A  . Number of children: N/A  . Years of education: N/A   Occupational History  .  [employer]   Social History Main Topics  . Smoking status: Never Smoker  . Smokeless tobacco: Never Used  . Alcohol use No  . Drug use: No  . Sexual activity: Not Asked   Other Topics Concern  . None   Social History Narrative  . None    Vitals:   04/05/17 0701  BP: 138/80  Pulse: 89  Resp: 12   Body mass index is 30.63 kg/m.  Physical Exam  Nursing note and vitals reviewed. Constitutional: He is oriented to person, place, and time. He appears well-developed. No distress.  HENT:  Head: Atraumatic.  Mouth/Throat: Oropharynx is clear and moist and mucous membranes are normal.  Eyes: Conjunctivae and EOM are normal. Pupils are equal, round, and reactive to light.  Neck: No tracheal deviation present. No thyroid mass and no thyromegaly present.  Cardiovascular: Normal rate and regular rhythm.   No murmur heard. Pulses:      Dorsalis pedis pulses are 2+ on the right side, and 2+ on the left side.  Respiratory: Effort normal and breath sounds normal. No respiratory distress.  GI: Soft. He exhibits no mass. There is no hepatomegaly. There is no tenderness.  Musculoskeletal: He exhibits no edema.  Lymphadenopathy:    He has no cervical adenopathy.  Neurological: He is alert and oriented to person, place, and time. He has normal strength.  Antalgic , stable gait, not assisted.  Skin: Skin is warm. No erythema.  Psychiatric: He has a normal mood and affect. Cognition and memory are normal.  Well groomed, good eye contact.    Diabetic foot exam:  Monofilament normal bilateral. Peripheral pulses present (DP). No calluses No hypertrophic/long toenails.   ASSESSMENT AND PLAN:   Mr. Brent Cook was seen today for establish care.  Diagnoses and all orders for this visit:  Lab Results  Component Value Date   HGBA1C 5.1 04/05/2017   Lab Results  Component Value Date    CREATININE 0.93 04/05/2017   BUN 17 04/05/2017   NA 140 04/05/2017   K 3.7 04/05/2017   CL 105 04/05/2017   CO2 26 04/05/2017   Lab Results  Component Value Date   MICROALBUR 3.1 (H) 04/05/2017    Essential hypertension  Adequately controlled. No changes in current management. DASH-low salt diet recommended. Eye exam recommended annually. F/U in 6 months, before if needed.  -     olmesartan-hydrochlorothiazide (BENICAR HCT) 20-12.5 MG tablet; Take 1 tablet by mouth daily. -     Basic metabolic panel  Controlled type 2 diabetes mellitus without complication, without long-term current use of insulin ([place])  HgA1C  Has been at goal, pending today. No changes in current management. Regular exercise as tolerated and  healthy diet with avoidance of added sugar food intake is an important part of treatment and recommended. Annual eye exam, periodic dental and foot care recommended. F/U in 5-6 months  -     Hemoglobin A1c -     Fructosamine -     Microalbumin / creatinine urine ratio  Erectile dysfunction, unspecified erectile dysfunction type  Stable. No changes in current management, some side effects discussed. F/U in 6-12 months.  -     CIALIS 20 MG tablet; Take 1 tablet (20 mg total) by mouth daily as needed for erectile dysfunction.     Aceyn Kathol G. Martinique, MD  [facility]. [place] office.

## 2017-04-07 ENCOUNTER — Encounter: Payer: Self-pay | Admitting: Family Medicine

## 2017-04-08 ENCOUNTER — Other Ambulatory Visit: Payer: Self-pay

## 2017-04-08 LAB — FRUCTOSAMINE: Fructosamine: 285 umol/L — ABNORMAL HIGH (ref 190–270)

## 2017-04-08 MED ORDER — METFORMIN HCL ER 500 MG PO TB24: 500.0000 mg | ORAL_TABLET | Freq: Every day | ORAL | Status: DC

## 2017-05-07 DIAGNOSIS — E119 Type 2 diabetes mellitus without complications: Secondary | ICD-10-CM | POA: Diagnosis not present

## 2017-05-07 DIAGNOSIS — I10 Essential (primary) hypertension: Secondary | ICD-10-CM | POA: Diagnosis not present

## 2017-05-07 DIAGNOSIS — Z7984 Long term (current) use of oral hypoglycemic drugs: Secondary | ICD-10-CM | POA: Diagnosis not present

## 2017-05-14 DIAGNOSIS — E119 Type 2 diabetes mellitus without complications: Secondary | ICD-10-CM | POA: Diagnosis not present

## 2017-05-14 DIAGNOSIS — M1712 Unilateral primary osteoarthritis, left knee: Secondary | ICD-10-CM | POA: Diagnosis not present

## 2017-05-14 DIAGNOSIS — I10 Essential (primary) hypertension: Secondary | ICD-10-CM | POA: Diagnosis not present

## 2017-07-28 ENCOUNTER — Other Ambulatory Visit: Payer: Self-pay | Admitting: Physician Assistant

## 2017-08-01 ENCOUNTER — Other Ambulatory Visit: Payer: Self-pay | Admitting: Family Medicine

## 2017-09-21 ENCOUNTER — Other Ambulatory Visit: Payer: Self-pay | Admitting: Family Medicine

## 2017-09-21 DIAGNOSIS — N529 Male erectile dysfunction, unspecified: Secondary | ICD-10-CM

## 2017-09-22 ENCOUNTER — Other Ambulatory Visit: Payer: Self-pay | Admitting: Family Medicine

## 2017-09-22 DIAGNOSIS — I10 Essential (primary) hypertension: Secondary | ICD-10-CM

## 2017-11-11 DIAGNOSIS — E119 Type 2 diabetes mellitus without complications: Secondary | ICD-10-CM | POA: Diagnosis not present

## 2017-11-11 DIAGNOSIS — Z23 Encounter for immunization: Secondary | ICD-10-CM | POA: Diagnosis not present

## 2017-11-16 ENCOUNTER — Emergency Department (HOSPITAL_COMMUNITY): Admission: EM | Admit: 2017-11-16 | Discharge: 2017-11-16 | Discharge status: Against Medical Advice | Payer: 59

## 2017-11-16 NOTE — ED Triage Notes (Signed)
No response in waiting area to take to triage room.

## 2017-11-16 NOTE — ED Triage Notes (Signed)
Called for triage, no response, registration reports they think pt left, will call again in the lobby

## 2017-11-16 NOTE — ED Notes (Addendum)
Pt called from the lobby with no response x3 

## 2017-11-16 NOTE — ED Notes (Addendum)
Pt called from the lobby with no response x2 

## 2018-02-06 ENCOUNTER — Other Ambulatory Visit: Payer: Self-pay | Admitting: Family Medicine

## 2018-03-25 ENCOUNTER — Other Ambulatory Visit: Payer: Self-pay | Admitting: Family Medicine

## 2018-03-25 DIAGNOSIS — I10 Essential (primary) hypertension: Secondary | ICD-10-CM

## 2018-04-01 DIAGNOSIS — M48061 Spinal stenosis, lumbar region without neurogenic claudication: Secondary | ICD-10-CM | POA: Diagnosis not present

## 2018-04-01 DIAGNOSIS — Z96659 Presence of unspecified artificial knee joint: Secondary | ICD-10-CM | POA: Diagnosis not present

## 2018-04-06 NOTE — Progress Notes (Signed)
HPI:  Mr. Brent Cook is a 57 y.o.male here today for his routine physical examination.  Last CPE: 1-2 years ago.  He has PCP at the [place], states that he follows q 4-6 months.  He lives with his wife and 47 yo daughter.  Regular exercise 3 or more times per week: None Following a healthy diet: Yes.   Chronic medical problems: DM II,HTN,OA,ED, and HLD.  Hx of STD's: Denies.  Immunization History  Administered Date(s) Administered  . Influenza,inj,Quad PF,6+ Mos 10/12/2014  . Pneumococcal Conjugate-13 06/04/2014  . Pneumococcal Polysaccharide-23 10/12/2014  . Td 10/08/2001  . Tdap 11/07/2011      -Hep C screening: Reported done at the [facility].   Last colon cancer screening: colonoscopy in 11/2015. Last prostate ca screening: Reporting PSA done at the [facility] a year ago.  Lab Results  Component Value Date   PSA 1.02 06/20/2015   Denies dysuria,increased urinary frequency, gross hematuria,or decreased urine output.  -Denies high alcohol intake, tobacco use, or Hx of illicit drug use.  -Follow up: Last follow up appt in 03/2017.  HTN: Dx around 2009.  Lab Results  Component Value Date   CREATININE 0.93 04/05/2017   BUN 17 04/05/2017   NA 140 04/05/2017   K 3.7 04/05/2017   CL 105 04/05/2017   CO2 26 04/05/2017   He is not checking BP at home.  Denies severe/frequent headache, visual changes, chest pain, dyspnea, palpitation, claudication, focal weakness, or edema.   DM II:  Dx around 2003.  Lab Results  Component Value Date   HGBA1C 5.1 04/05/2017   Denies abdominal pain, nausea,vomiting, polydipsia,polyuria, or polyphagia.  Las eye exam a year ago,planning on scheduling appt.  BS's: FG 110-130.  Hyperlipidemia:  Currently on cholesterol medication,prescribed at the [facility] [place].He does not recall name. Following a low fat diet: Yes..  He has not noted side effects with medication.  Lab Results  Component Value Date   CHOL 129 06/20/2015   HDL  30.30 (L) 06/20/2015   LDLCALC 81 06/20/2015   TRIG 92.0 06/20/2015   CHOLHDL 4 06/20/2015   ED: He is omn Cialis 20 mg daily as needed. No side effects reported.   S/P left knee surgery 11/2017 through the [place]. He is following with ortho. Last visit a week ago. Right after surgery he noted medial aspect of foot and "weaker then" right one.  Stable. Pending test tomorrow, "nerve testing."    Review of Systems  Constitutional: Negative for activity change, appetite change, fatigue, fever and unexpected weight change.  HENT: Negative for dental problem, nosebleeds, sore throat, trouble swallowing and voice change.   Eyes: Negative for redness and visual disturbance.  Respiratory: Negative for cough, shortness of breath and wheezing.   Cardiovascular: Negative for chest pain, palpitations and leg swelling.  Gastrointestinal: Negative for abdominal pain, blood in stool, nausea and vomiting.  Endocrine: Negative for cold intolerance, heat intolerance, polydipsia, polyphagia and polyuria.  Genitourinary: Negative for decreased urine volume, dysuria, genital sores, hematuria and testicular pain.  Musculoskeletal: Positive for arthralgias. Negative for gait problem and myalgias.  Skin: Negative for color change and rash.  Allergic/Immunologic: Negative for environmental allergies.  Neurological: Negative for syncope, weakness and headaches.  Hematological: Negative for adenopathy. Does not bruise/bleed easily.  Psychiatric/Behavioral: Negative for confusion and sleep disturbance. The patient is not nervous/anxious.   All other systems reviewed and are negative.    Current Outpatient Medications on File Prior to Visit  Medication Sig Dispense  Refill  . metFORMIN (GLUCOPHAGE-XR) 500 MG 24 hr tablet Take 1 tablet (500 mg total) by mouth at bedtime.     No current facility-administered medications on file prior to visit.      Past Medical History:  Diagnosis Date  . Arthritis     left knee  . Asthma    as child  . Diabetes mellitus    type 2  . Erectile dysfunction   . Hypertension     Past Surgical History:  Procedure Laterality Date  . meniscus tear     pt. reported 10-15 years ago  . TONSILLECTOMY     childhood  . 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 WISDOM TOOTH EXTRACTION     with sedation    Allergies  Allergen Reactions  . Other     Family History  Problem Relation Age of Onset  . Cancer Father   . Colon cancer Father        dx'd late 62's early 40's  . Cancer Mother   . Cancer Maternal Grandfather   . Colon polyps Neg Hx   . Rectal cancer Neg Hx   . Stomach cancer Neg Hx     Social History   Socioeconomic History  . Marital status: Married    Spouse name: Not on file  . Number of children: Not on file  . Years of education: Not on file  . Highest education level: Not on file  Occupational History    Employer: [employer]  Social Needs  . Financial resource strain: Not on file  . Food insecurity:    Worry: Not on file    Inability: Not on file  . Transportation needs:    Medical: Not on file    Non-medical: Not on file  Tobacco Use  . Smoking status: Never Smoker  . Smokeless tobacco: Never Used  Substance and Sexual Activity  . Alcohol use: No    Alcohol/week: 0.0 oz  . Drug use: No  . Sexual activity: Not on file  Lifestyle  . Physical activity:    Days per week: Not on file    Minutes per session: Not on file  . Stress: Not on file  Relationships  . Social connections:    Talks on phone: Not on file    Gets together: Not on file    Attends religious service: Not on file    Active member of club or organization: Not on file    Attends meetings of clubs or organizations: Not on file    Relationship status: Not on file  Other Topics Concern  . Not on file  Social  History Narrative  . Not on file     Vitals:   04/07/18 0810  BP: 124/72  Pulse: 73  Resp: 12  Temp: 98.3 F (36.8 C)  SpO2: 96%   Body mass index is 32.11 kg/m.   Wt Readings from Last 3 Encounters:  04/07/18 205 lb (93 kg)  04/05/17 195 lb 9 oz (88.7 kg)  11/08/16 205 lb (93 kg)     Physical Exam  Nursing note and vitals reviewed. Constitutional: He is oriented to person, place, and time. He appears well-developed. No distress.  HENT:  Head: Normocephalic and atraumatic.  Right Ear: Hearing, tympanic membrane, external ear and ear canal normal.  Left Ear: External ear normal.  Mouth/Throat: Oropharynx is clear and moist and mucous membranes are normal.  Cerumen excess left ear canal, could not see TM.   Eyes: Pupils are equal, round, and reactive to light. Conjunctivae and EOM are normal.  Neck: Normal range of motion. No tracheal deviation present. No thyromegaly present.  Cardiovascular: Normal rate and regular rhythm.  No murmur heard. Pulses:      Dorsalis pedis pulses are 2+ on the right side, and 2+ on the left side.  Respiratory: Effort normal and breath sounds normal. No respiratory distress.  GI: Soft. He exhibits no mass. There is no tenderness.  Genitourinary: Penis normal. Prostate is not enlarged and not tender. Right testis shows no swelling and no tenderness. Left testis shows no swelling and no tenderness.  Musculoskeletal: He exhibits no edema or tenderness.  No major deformities appreciated and no signs of synovitis.  Lymphadenopathy:    He has no cervical adenopathy.       Right: No supraclavicular adenopathy present.       Left: No supraclavicular adenopathy present.  Neurological: He is alert and oriented to person, place, and time. He has normal strength. No cranial nerve deficit or sensory deficit. Coordination and gait normal.  Reflex Scores:      Bicep reflexes are 2+ on the right side and 2+ on the left side.      Patellar reflexes are  2+ on the right side and 2+ on the left side. Skin: Skin is warm. No erythema.  Psychiatric: He has a normal mood and affect.     ASSESSMENT AND PLAN:   Mr. Arbor was seen today for annual exam and follow up.   Orders Placed This Encounter  Procedures  . Hemoglobin A1c  . Lipid panel  . Comprehensive metabolic panel  . PSA(Must document that pt has been informed of limitations of PSA testing.)  . Microalbumin / creatinine urine ratio   Lab Results  Component Value Date   HGBA1C 7.1 (H) 04/07/2018   Lab Results  Component Value Date   CREATININE 1.00 04/07/2018   BUN 16 04/07/2018   NA 140 04/07/2018   K 3.9 04/07/2018   CL 103 04/07/2018   CO2 30 04/07/2018    Lab Results  Component Value Date   CHOL 130 04/07/2018   HDL 29.60 (L) 04/07/2018   LDLCALC 78 04/07/2018   TRIG 110.0 04/07/2018   CHOLHDL 4 04/07/2018   Lab Results  Component Value Date   MICROALBUR 57.8 (H) 04/07/2018    Routine general medical examination at a health care facility  We discussed the importance of regular physical activity and healthy diet for prevention of chronic illness and/or complications. Preventive guidelines reviewed. Vaccination up to date.  Next CPE in a year.  Prostate cancer screening -     PSA(Must document that pt has been informed of limitations of PSA testing.)   Essential hypertension Adequately controlled. No changes in current management. He will continue low-salt diet. Eye exam recommended annually. F/U in 6 months at the [place], before if needed.   Diabetes mellitus type II, controlled, with no complications ([place]) [id number] at goal. No changes in current management, treatment will be adjusted depending on hemoglobin A1c. Regular exercise and healthy diet with avoidance of added sugar food intake is an important part of treatment and recommended. Annual eye exam, periodic dental and foot care recommended. F/U in 5-6 months at the [place].   Pure  hypercholesterolemia He states that he is taking a statin medication  prescribed through the [place]. Instructed to call and let me know the name of medication. No changes in current management. Follow-up in 6 to 12 months depending on lipid panel results.  Erectile dysfunction He is tolerating Cialis well, no side effects. No changes in current management. Follow-up in a year.   He is going to schedule an eye exam today. He will continue following through the [facility] [place].    Return in 1 year (on 04/08/2019) for Need F/U at the [place] in 4-6 months..      Betty G. Martinique, MD  [facility]. [place] office.

## 2018-04-07 ENCOUNTER — Encounter: Payer: Self-pay | Admitting: Family Medicine

## 2018-04-07 ENCOUNTER — Ambulatory Visit (INDEPENDENT_AMBULATORY_CARE_PROVIDER_SITE_OTHER): Payer: 59 | Admitting: Family Medicine

## 2018-04-07 VITALS — BP 124/72 | HR 73 | Temp 98.3°F | Resp 12 | Ht 67.0 in | Wt 205.0 lb

## 2018-04-07 DIAGNOSIS — Z125 Encounter for screening for malignant neoplasm of prostate: Secondary | ICD-10-CM | POA: Diagnosis not present

## 2018-04-07 DIAGNOSIS — N529 Male erectile dysfunction, unspecified: Secondary | ICD-10-CM | POA: Diagnosis not present

## 2018-04-07 DIAGNOSIS — E119 Type 2 diabetes mellitus without complications: Secondary | ICD-10-CM | POA: Diagnosis not present

## 2018-04-07 DIAGNOSIS — Z0000 Encounter for general adult medical examination without abnormal findings: Secondary | ICD-10-CM

## 2018-04-07 DIAGNOSIS — I10 Essential (primary) hypertension: Secondary | ICD-10-CM

## 2018-04-07 DIAGNOSIS — E7800 Pure hypercholesterolemia, unspecified: Secondary | ICD-10-CM

## 2018-04-07 LAB — MICROALBUMIN / CREATININE URINE RATIO
CREATININE, U: 259.9 mg/dL
MICROALB/CREAT RATIO: 22.2 mg/g (ref 0.0–30.0)
Microalb, Ur: 57.8 mg/dL — ABNORMAL HIGH (ref 0.0–1.9)

## 2018-04-07 LAB — PSA: PSA: 1.39 ng/mL (ref 0.10–4.00)

## 2018-04-07 LAB — LIPID PANEL
CHOLESTEROL: 130 mg/dL (ref 0–200)
HDL: 29.6 mg/dL — AB (ref >39.00)
LDL CALC: 78 mg/dL (ref 0–99)
NONHDL: 100.31
Total CHOL/HDL Ratio: 4
Triglycerides: 110 mg/dL (ref 0.0–149.0)
VLDL: 22 mg/dL (ref 0.0–40.0)

## 2018-04-07 LAB — COMPREHENSIVE METABOLIC PANEL
ALBUMIN: 4.4 g/dL (ref 3.5–5.2)
ALK PHOS: 101 U/L (ref 39–117)
ALT: 20 U/L (ref 0–53)
AST: 20 U/L (ref 0–37)
BUN: 16 mg/dL (ref 6–23)
CHLORIDE: 103 meq/L (ref 96–112)
CO2: 30 mEq/L (ref 19–32)
Calcium: 9.5 mg/dL (ref 8.4–10.5)
Creatinine, Ser: 1 mg/dL (ref 0.40–1.50)
GFR: 99.36 mL/min (ref >60.00)
Glucose, Bld: 170 mg/dL — ABNORMAL HIGH (ref 70–99)
POTASSIUM: 3.9 meq/L (ref 3.5–5.1)
Sodium: 140 mEq/L (ref 135–145)
TOTAL PROTEIN: 7.2 g/dL (ref 6.0–8.3)
Total Bilirubin: 0.6 mg/dL (ref 0.2–1.2)

## 2018-04-07 LAB — HEMOGLOBIN A1C: Hgb A1c MFr Bld: 7.1 % — ABNORMAL HIGH (ref 4.6–6.5)

## 2018-04-07 MED ORDER — CIALIS 20 MG PO TABS: 20.0000 mg | ORAL_TABLET | Freq: Every day | ORAL | 6 refills | Status: DC | PRN

## 2018-04-07 MED ORDER — OLMESARTAN MEDOXOMIL-HCTZ 20-12.5 MG PO TABS: 1.0000 | ORAL_TABLET | Freq: Every day | ORAL | 1 refills | Status: DC

## 2018-04-07 NOTE — Assessment & Plan Note (Signed)
HgA1C at goal. No changes in current management, treatment will be adjusted depending on hemoglobin A1c. Regular exercise and healthy diet with avoidance of added sugar food intake is an important part of treatment and recommended. Annual eye exam, periodic dental and foot care recommended. F/U in 5-6 months at the [place].

## 2018-04-07 NOTE — Assessment & Plan Note (Signed)
He is tolerating Cialis well, no side effects. No changes in current management. Follow-up in a year.

## 2018-04-07 NOTE — Assessment & Plan Note (Signed)
Adequately controlled. No changes in current management. He will continue low-salt diet. Eye exam recommended annually. F/U in 6 months at the [place], before if needed.

## 2018-04-07 NOTE — Patient Instructions (Addendum)
A few things to remember from today's visit:   Routine general medical examination at a health care facility  Encounter for HCV screening test for high risk patient  Essential hypertension - Plan: olmesartan-hydrochlorothiazide (BENICAR HCT) 20-12.5 MG tablet, Comprehensive metabolic panel  Controlled type 2 diabetes mellitus without complication, without long-term current use of insulin (HCC) - Plan: Hemoglobin A1c, Comprehensive metabolic panel, Microalbumin / creatinine urine ratio  Pure hypercholesterolemia - Plan: Lipid panel, Comprehensive metabolic panel  Erectile dysfunction, unspecified erectile dysfunction type - Plan: CIALIS 20 MG tablet  Prostate cancer screening - Plan: PSA(Must document that pt has been informed of limitations of PSA testing.)   At least 150 minutes of moderate exercise per week, daily brisk walking for 15-30 min is a good exercise option. Healthy diet low in saturated (animal) fats and sweets and consisting of fresh fruits and vegetables, lean meats such as fish and white chicken and whole grains.  - Vaccines:  Tdap vaccine every 10 years.  Shingles vaccine recommended at age 72, could be given after 56 years of age but not sure about insurance coverage.  Pneumonia vaccines:  Prevnar 15 at 6 and Pneumovax at 25.   -Screening recommendations for low/normal risk males:  Screening for diabetes at age 22 and every 3 years. Earlier screening if cardiovascular risk factors.   Lipid screening at 35 and every 3 years. Screening starts in younger males with cardiovascular risk factors.  Colon cancer screening at age 67 and until age 40.  Prostate cancer screening: some controversy, starts usually at 45: Rectal exam and PSA.  Aortic Abdominal Aneurism once between 2 and 21 years old if ever smoker.  Also recommended:  1. Dental visit- Brush and floss your teeth twice daily; visit your dentist twice a year. 2. Eye doctor- Get an eye exam at least  every 2 years. 3. Helmet use- Always wear a helmet when riding a bicycle, motorcycle, rollerblading or skateboarding. 4. Safe sex- If you may be exposed to sexually transmitted infections, use a condom. 5. Seat belts- Seat belts can save your live; always wear one. 6. Smoke/Carbon Monoxide detectors- These detectors need to be installed on the appropriate level of your home. Replace batteries at least once a year. 7. Skin cancer- When out in the sun please cover up and use sunscreen 15 SPF or higher. 8. Violence- If anyone is threatening or hurting you, please tell your healthcare provider.  9. Drink alcohol in moderation- Limit alcohol intake to one drink or less per day. Never drink and drive.   Please be sure medication list is accurate. If a new problem present, please set up appointment sooner than planned today.

## 2018-04-07 NOTE — Assessment & Plan Note (Signed)
He states that he is taking a statin medication prescribed through the [place]. Instructed to call and let me know the name of medication. No changes in current management. Follow-up in 6 to 12 months depending on lipid panel results.

## 2018-04-18 DIAGNOSIS — M545 Low back pain: Secondary | ICD-10-CM | POA: Diagnosis not present

## 2018-04-23 DIAGNOSIS — G609 Hereditary and idiopathic neuropathy, unspecified: Secondary | ICD-10-CM | POA: Diagnosis not present

## 2018-04-23 DIAGNOSIS — M545 Low back pain: Secondary | ICD-10-CM | POA: Diagnosis not present

## 2018-04-23 DIAGNOSIS — M79672 Pain in left foot: Secondary | ICD-10-CM | POA: Diagnosis not present

## 2018-05-09 DIAGNOSIS — I10 Essential (primary) hypertension: Secondary | ICD-10-CM | POA: Diagnosis not present

## 2018-05-09 DIAGNOSIS — Z8042 Family history of malignant neoplasm of prostate: Secondary | ICD-10-CM | POA: Diagnosis not present

## 2018-05-09 DIAGNOSIS — E119 Type 2 diabetes mellitus without complications: Secondary | ICD-10-CM | POA: Diagnosis not present

## 2018-05-14 DIAGNOSIS — I10 Essential (primary) hypertension: Secondary | ICD-10-CM | POA: Diagnosis not present

## 2018-05-14 DIAGNOSIS — E1129 Type 2 diabetes mellitus with other diabetic kidney complication: Secondary | ICD-10-CM | POA: Diagnosis not present

## 2018-05-14 DIAGNOSIS — R809 Proteinuria, unspecified: Secondary | ICD-10-CM | POA: Diagnosis not present

## 2018-05-23 DIAGNOSIS — E113293 Type 2 diabetes mellitus with mild nonproliferative diabetic retinopathy without macular edema, bilateral: Secondary | ICD-10-CM | POA: Diagnosis not present

## 2018-05-30 ENCOUNTER — Encounter: Payer: 59 | Admitting: Neurology

## 2018-05-30 ENCOUNTER — Encounter: Payer: Self-pay | Admitting: Neurology

## 2018-06-13 DIAGNOSIS — E119 Type 2 diabetes mellitus without complications: Secondary | ICD-10-CM | POA: Diagnosis not present

## 2018-06-13 DIAGNOSIS — I10 Essential (primary) hypertension: Secondary | ICD-10-CM | POA: Diagnosis not present

## 2018-06-13 DIAGNOSIS — Z79899 Other long term (current) drug therapy: Secondary | ICD-10-CM | POA: Diagnosis not present

## 2018-06-27 ENCOUNTER — Ambulatory Visit: Payer: 59 | Admitting: Neurology

## 2018-06-27 ENCOUNTER — Encounter (INDEPENDENT_AMBULATORY_CARE_PROVIDER_SITE_OTHER): Payer: 59 | Admitting: Neurology

## 2018-06-27 DIAGNOSIS — R202 Paresthesia of skin: Secondary | ICD-10-CM | POA: Diagnosis not present

## 2018-06-27 DIAGNOSIS — Z0289 Encounter for other administrative examinations: Secondary | ICD-10-CM

## 2018-06-27 NOTE — Procedures (Signed)
Full Name: Brent Cook Gender: Male MRN #: [id number] Date of Birth: 03/13/2061    Visit Date: 06/27/18 09:45 Age: 57 Years 61 Months Old Examining Physician: Marcial Pacas, MD  Referring Physician: Otelia Sergeant, MD History: 57 year old male, with history of left knee replacement in February 2018, presented with left foot numbness involving plantar surface of left first 3 toes, getting worse over the past few months, evolved into achy pain with prolonged weightbearing.  On examination: He has significant tenderness between left first and second metatarsal bone, radiating between left toes, there was no sensory loss on examination, deep tendon reflex was hypoactive and symmetric  Summary of the tests: Nerve conduction study: Bilateral sural sensory responses were normal; bilateral superficial peroneal sensory responses showed mildly prolonged peak latency, with mild to moderately decreased snap amplitude on the left side.  Left peroneal to EDB and tibial motor responses were normal.  Electromyography: Selective needle examination of left lower extremity muscles were normal.  Conclusion:  This is a normal study.  There is no electrodiagnostic evidence of large fiber peripheral neuropathy, left lower extremity neuropathy or left lumbosacral radiculopathy.  The mildly prolonged peak latency to multiple sensory nerve conduction study could due to cold limb temperature.   ------------------------------- Marcial Pacas, M.D. PhD  [facility] [place], [place] [address] Tel: [phone] Fax: [phone]        [facility]    Nerve / Sites Muscle Latency Ref. Amplitude Ref. Rel Amp Segments Distance Velocity Ref. Area    ms ms mV mV %  cm m/s m/s mVms  L Peroneal - EDB     Ankle EDB 5.2 ?6.5 3.4 ?2.0 100 Ankle - EDB 9   10.5     Fib head EDB 12.3  3.7  107 Fib head - Ankle 32 45 ?44 12.0     Pop fossa EDB 14.5  3.6  99.2 Pop fossa - Fib head 10 45 ?44 12.5           Pop fossa - Ankle      L Tibial - AH     Ankle AH 4.6 ?5.8 8.0 ?4.0 100 Ankle - AH 9   22.4     Pop fossa AH 14.0  7.4  92.9 Pop fossa - Ankle 38 41 ?41 36.2         SNC    Nerve / Sites Rec. Site Peak Lat Ref.  Amp Ref. Segments Distance    ms ms V V  cm  L Sural - Ankle (Calf)     Calf Ankle 4.7 ?4.4 7 ?6 Calf - Ankle 14  R Sural - Ankle (Calf)     Calf Ankle 4.5 ?4.4 5 ?6 Calf - Ankle 14  L Superficial peroneal - Ankle     Lat leg Ankle 4.6 ?4.4 3 ?6 Lat leg - Ankle 14  R Superficial peroneal - Ankle     Lat leg Ankle 4.6 ?4.4 4 ?6 Lat leg - Ankle 14             F  Wave    Nerve F Lat Ref.   ms ms  L Tibial - AH 25.1 ?56.0       EMG full       EMG Summary Table    Spontaneous MUAP Recruitment  Muscle IA Fib PSW Fasc Other Amp Dur. Poly Pattern  L. Tibialis anterior Normal None None None _______ Normal Normal Normal Normal  L. Tibialis posterior Normal None None None _______ Normal Normal Normal Normal  L. Peroneus longus Normal None None None _______ Normal Normal Normal Normal  L. Vastus lateralis Normal None None None _______ Normal Normal Normal Normal  L. Abductor hallucis Normal None None None _______ Normal Normal Normal Normal

## 2018-07-28 ENCOUNTER — Encounter: Payer: Self-pay | Admitting: Family Medicine

## 2018-07-28 ENCOUNTER — Ambulatory Visit (INDEPENDENT_AMBULATORY_CARE_PROVIDER_SITE_OTHER): Payer: 59 | Admitting: Family Medicine

## 2018-07-28 VITALS — BP 120/70 | HR 91 | Temp 98.9°F | Resp 12 | Ht 67.0 in | Wt 203.8 lb

## 2018-07-28 DIAGNOSIS — R519 Headache, unspecified: Secondary | ICD-10-CM

## 2018-07-28 DIAGNOSIS — I10 Essential (primary) hypertension: Secondary | ICD-10-CM | POA: Diagnosis not present

## 2018-07-28 DIAGNOSIS — R51 Headache: Secondary | ICD-10-CM

## 2018-07-28 DIAGNOSIS — J309 Allergic rhinitis, unspecified: Secondary | ICD-10-CM

## 2018-07-28 DIAGNOSIS — R0981 Nasal congestion: Secondary | ICD-10-CM

## 2018-07-28 MED ORDER — FLUTICASONE PROPIONATE 50 MCG/ACT NA SUSP: 2.0000 | Freq: Every day | NASAL | 2 refills | Status: DC

## 2018-07-28 NOTE — Patient Instructions (Addendum)
A few things to remember from today's visit:   Frontal headache  Nasal sinus congestion - Plan: fluticasone (FLONASE) 50 MCG/ACT nasal spray  Allergic rhinitis, unspecified seasonality, unspecified trigger - Plan: fluticasone (FLONASE) 50 MCG/ACT nasal spray  Saline nasal irrigations several times per day. Over-the-counter Zyrtec 10 mg daily may help. There are 2 forms of allergic rhinitis: . Seasonal (hay fever): Caused by an allergy to pollen and/or mold spores in the air. Pollen is the fine powder that comes from the stamen of flowering plants. It can be carried through the air and is easily inhaled. Symptoms are seasonal and usually occur in spring, late summer, and fall. Marland Kitchen Perennial: Caused by other allergens such as dust mites, pet hair or dander, or mold. Symptoms occur year-round.  Symptoms: Your symptoms can vary, depending on the severity of your allergies. Symptoms can include: Sneezing, coughing.itching (mostly eyes, nose, mouth, throat and skin),runny nose,stuffy nose.headache,pressure in the nose and cheeks,ear fullness and popping, sore throat.watery, red, or swollen eyes,dark circles under your eyes,trouble smelling, and sometimes hives.  Allergic rhinitis cannot be prevented. You can help your symptoms by avoiding the things that you are allergic, including: . Keeping windows closed. This is especially important during high-pollen seasons. . Washing your hands after petting animals. . Using dust- and mite-proof bedding and mattress covers. . Wearing glasses outside to protect your eyes. . Showering before bed to wash off allergens from hair and skin. You can also avoid things that can make your symptoms worse, such as: . aerosol sprays . air pollution . cold temperatures . humidity . irritating fumes . tobacco smoke . wind . wood smoke.   Antihistamines help reduce the sneezing, runny nose, and itchiness of allergies. These come in pill form and as nasal sprays.  Allegra,Zyrtec,or Claritin are some examples. Decongestants, such as pseudoephedrine and phenylephrine, help temporarily relieve the stuffy nose of allergies. Decongestants are found in many medicines and come as pills, nose sprays, and nose drops. They could increase heart rate and cause tachycardia and tremor. Nasal Afrin should not be used for more than 3 days because you can become dependent on them. This causes you to feel even more stopped-up when you try to quit using them.  Nasal sprays: steroids or antihistaminics. Over the counter intranasal sterids: Nasocort,Rhinocort,or Flonase.You won't notice their benefits for up to 2 weeks after starting them. Allergy shots or sublingual tablets when other treatment do not help.This is done by immunologists.   Please be sure medication list is accurate. If a new problem present, please set up appointment sooner than planned today.

## 2018-07-28 NOTE — Progress Notes (Signed)
ACUTE VISIT   HPI:  Chief Complaint  Patient presents with  . Headache    [name] is a 57 y.o. male, who is here today complaining of 2 days of "little" intermittent frontal headache and sinus pressure, 3-4/10. He thought it may be related to elevated BP,checked and it was 123/85.  No associated visual changes ,nausea,vomiting,or photophobia. Negative for focal weakness, numbness,or tingling.  Denies Hx of migraine or headache.  He took Excedrin x1 and headache improved.  Hx of allergic rhinitis,he is not on treatment.  Problem has improved. He is not sure about exacerbating factors.  HTN on Benicar HCT. He has been checking BP for the apst 3 days and it has been "good," 120's/80's.    Review of Systems  Constitutional: Negative for activity change, appetite change, fatigue and fever.  HENT: Positive for congestion, rhinorrhea and sinus pressure. Negative for nosebleeds, sore throat and trouble swallowing.   Eyes: Negative for photophobia, redness and visual disturbance.  Respiratory: Negative for cough, shortness of breath and wheezing.   Cardiovascular: Negative for chest pain, palpitations and leg swelling.  Gastrointestinal: Negative for abdominal pain, nausea and vomiting.  Genitourinary: Negative for decreased urine volume and hematuria.  Allergic/Immunologic: Positive for environmental allergies.  Neurological: Positive for headaches. Negative for syncope, weakness and numbness.      Current Outpatient Medications on File Prior to Visit  Medication Sig Dispense Refill  . CIALIS 20 MG tablet Take 1 tablet (20 mg total) by mouth daily as needed for erectile dysfunction. 15 tablet 6  . olmesartan-hydrochlorothiazide (BENICAR HCT) 20-12.5 MG tablet Take 1 tablet by mouth daily. 90 tablet 1   No current facility-administered medications on file prior to visit.      Past Medical History:  Diagnosis Date  . Arthritis    left knee  .  Asthma    as child  . Diabetes mellitus    type 2  . Erectile dysfunction   . Hypertension    Allergies  Allergen Reactions  . Other     Social History   Socioeconomic History  . Marital status: Married    Spouse name: Not on file  . Number of children: Not on file  . Years of education: Not on file  . Highest education level: Not on file  Occupational History    Employer: [employer]  Social Needs  . Financial resource strain: Not on file  . Food insecurity:    Worry: Not on file    Inability: Not on file  . Transportation needs:    Medical: Not on file    Non-medical: Not on file  Tobacco Use  . Smoking status: Never Smoker  . Smokeless tobacco: Never Used  Substance and Sexual Activity  . Alcohol use: No    Alcohol/week: 0.0 standard drinks  . Drug use: No  . Sexual activity: Not on file  Lifestyle  . Physical activity:    Days per week: Not on file    Minutes per session: Not on file  . Stress: Not on file  Relationships  . Social connections:    Talks on phone: Not on file    Gets together: Not on file    Attends religious service: Not on file    Active member of club or organization: Not on file    Attends meetings of clubs or organizations: Not on file    Relationship status: Not on file  Other Topics Concern  . Not  on file  Social History Narrative  . Not on file    Vitals:   07/28/18 1408  BP: 120/70  Pulse: 91  Resp: 12  Temp: 98.9 F (37.2 C)  SpO2: 96%   Body mass index is 31.92 kg/m.    Physical Exam  Nursing note and vitals reviewed. Constitutional: He is oriented to person, place, and time. He appears well-developed. He does not appear ill. No distress.  HENT:  Head: Normocephalic and atraumatic.  Nose: Septal deviation present. Right sinus exhibits no maxillary sinus tenderness and no frontal sinus tenderness. Left sinus exhibits no maxillary sinus tenderness and no frontal sinus tenderness.  Mouth/Throat: Oropharynx is clear and  moist and mucous membranes are normal.  Hypertrophic turbinates. Normal sinus transillumination.   Eyes: Conjunctivae and EOM are normal.  Cardiovascular: Normal rate and regular rhythm.  No murmur heard. Respiratory: Effort normal and breath sounds normal. No respiratory distress.  GI: Soft. He exhibits no mass. There is no tenderness.  Musculoskeletal: He exhibits no edema.  Lymphadenopathy:    He has no cervical adenopathy.  Neurological: He is alert and oriented to person, place, and time. He has normal strength. No cranial nerve deficit. Gait normal.  Reflex Scores:      Bicep reflexes are 2+ on the right side and 2+ on the left side.      Patellar reflexes are 2+ on the right side and 2+ on the left side. Skin: Skin is warm. No rash noted. No erythema.  Psychiatric: His mood appears anxious.  Well groomed, good eye contact.      ASSESSMENT AND PLAN:   Mr. Brent Cook was seen today for headache.  Diagnoses and all orders for this visit:  Frontal headache  We discussed possible etiologies. ? Allergies,tension headache. Improving. Instructed about warning signs.  Nasal sinus congestion  I do not think symptoms are caused by infectious process,so abx not recommended. Flonase nasal spray recommended for now.  -     fluticasone (FLONASE) 50 MCG/ACT nasal spray; Place 2 sprays into both nostrils daily. Because HTN I do not recommend OTC decongestants.  Allergic rhinitis, unspecified seasonality, unspecified trigger  OTC antihistaminic. Saline nasal irrigations several times per day. Flonase nasal spray daily for a few days then prn.  -     fluticasone (FLONASE) 50 MCG/ACT nasal spray; Place 2 sprays into both nostrils daily.  Essential hypertension  Adequately controlled. No changes in current management. Continue monitoring BP's.      Return if symptoms worsen or fail to improve.       Antwaun Buth G. Martinique, MD  [facility]. [place]  office.

## 2018-07-29 ENCOUNTER — Other Ambulatory Visit: Payer: Self-pay | Admitting: Family Medicine

## 2018-07-29 ENCOUNTER — Other Ambulatory Visit: Payer: Self-pay | Admitting: *Deleted

## 2018-07-29 MED ORDER — METFORMIN HCL ER 500 MG PO TB24: 500.0000 mg | ORAL_TABLET | Freq: Every day | ORAL | 2 refills | Status: DC

## 2018-07-31 ENCOUNTER — Encounter: Payer: Self-pay | Admitting: Family Medicine

## 2018-09-02 ENCOUNTER — Telehealth: Payer: Self-pay | Admitting: Family Medicine

## 2018-09-02 NOTE — Telephone Encounter (Signed)
Returned call to pt who states that [facility] in [place] faxed a request on today for the pt to have have liver enzymes redrawn before having a stem cell transplant for his sister. 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 Pt states that [facility] voiced concern and results were a false positive because his liver enzymes should have been elevated with him testing positive for Hepatitis B. Pt states the request to have labs redrawn was faxed from [facility] hospital today around 3 pm.

## 2018-09-02 NOTE — Telephone Encounter (Signed)
Message sent to Dr. Jordan for review. 

## 2018-09-02 NOTE — Telephone Encounter (Signed)
Patient called and states he was supposed  to have a request for labs to be  Redrawn.,He is calling to follow up ,. Please refer to previous message .

## 2018-09-02 NOTE — Telephone Encounter (Signed)
Copied from [place] [id number]. Topic: Quick Communication - See Telephone Encounter >> Sep 02, 2018 12:24 PM Rutherford Nail, NT wrote: CRM for notification. See Telephone encounter for: 09/02/18. Patient calling and would like to speak with a nurse regarding his lab results from his last CPE on 04/07/18. States that he has some questions regarding his liver enzymes.  CB#: [phone]

## 2018-09-03 NOTE — Telephone Encounter (Signed)
Spoke with patient and informed him that I have not received any paperwork as of yet. Looked through faxes at the front desk and still hadn't found anything. Patient called and informed, patient will come in next week to have labs redrawn.  Copied from [place] [id number]. Topic: Quick Communication - See Telephone Encounter >> Sep 02, 2018 12:24 PM Rutherford Nail, NT wrote: CRM for notification. See Telephone encounter for: 09/02/18. Patient calling and would like to speak with a nurse regarding his lab results from his last CPE on 04/07/18. States that he has some questions regarding his liver enzymes.  CB#: [phone] >> Sep 03, 2018 10:46 AM Ahmed Prima L wrote: Patient is calling to check the status. He would like a call back. [phone]

## 2018-09-09 NOTE — Telephone Encounter (Signed)
Katie with [facility] is needing labs drawn on the pt.  She states she was instructed by someone to email to Norlene Duel the request as well. But has not heard anything back. Joellen Jersey is going to refax this lab request.  CB [phone]

## 2018-09-10 ENCOUNTER — Other Ambulatory Visit: Payer: Self-pay | Admitting: Family Medicine

## 2018-09-10 NOTE — Telephone Encounter (Signed)
[employer] and informed her that I still haven't received any paperwork. She stated that she sent fax twice on 09/09/18, sent an email to Norlene Duel last week, as well as faxed the paperwork on last week. Joellen Jersey stated that she would fax paperwork again today. I have personally been to the front on last week as well as today to look for faxes on the machine and in doctor's folder and have not seen anything as of 11 am on 09/10/18.

## 2018-09-10 NOTE — Telephone Encounter (Signed)
Still have not received paperwork from [facility] concerning labs. Spoke with Rachel Bo and he stated that he has not received an email. Please advise on labs.

## 2018-09-10 NOTE — Addendum Note (Signed)
Addended by: Gwenyth Ober R on: 09/10/2018 02:40 PM   Modules accepted: Orders

## 2018-09-10 NOTE — Telephone Encounter (Signed)
Patient informed that paperwork has been received and that he can come in to have labs drawn. Patient verbalized understanding and scheduled lab appointment for 09/11/18.

## 2018-09-11 ENCOUNTER — Other Ambulatory Visit (INDEPENDENT_AMBULATORY_CARE_PROVIDER_SITE_OTHER): Payer: 59

## 2018-09-11 DIAGNOSIS — I10 Essential (primary) hypertension: Secondary | ICD-10-CM | POA: Diagnosis not present

## 2018-09-11 DIAGNOSIS — Z1159 Encounter for screening for other viral diseases: Secondary | ICD-10-CM

## 2018-09-11 LAB — COMPREHENSIVE METABOLIC PANEL
ALT: 21 U/L (ref 0–53)
AST: 16 U/L (ref 0–37)
Albumin: 4.6 g/dL (ref 3.5–5.2)
Alkaline Phosphatase: 88 U/L (ref 39–117)
BUN: 21 mg/dL (ref 6–23)
CALCIUM: 9.8 mg/dL (ref 8.4–10.5)
CHLORIDE: 101 meq/L (ref 96–112)
CO2: 28 meq/L (ref 19–32)
Creatinine, Ser: 1.18 mg/dL (ref 0.40–1.50)
GFR: 81.96 mL/min (ref >60.00)
GLUCOSE: 205 mg/dL — AB (ref 70–99)
Potassium: 3.8 mEq/L (ref 3.5–5.1)
Sodium: 138 mEq/L (ref 135–145)
Total Bilirubin: 0.9 mg/dL (ref 0.2–1.2)
Total Protein: 7.5 g/dL (ref 6.0–8.3)

## 2018-09-12 ENCOUNTER — Telehealth: Payer: Self-pay | Admitting: *Deleted

## 2018-09-12 NOTE — Telephone Encounter (Signed)
Prior auth for Cialis 20mg  sent to [email or web address]-key [email or web address].

## 2018-09-13 LAB — ACUTE HEP PANEL AND HEP B SURFACE AB
HEPATITIS C ANTIBODY REFILL$(REFL): NONREACTIVE
Hep A IgM: NONREACTIVE
Hep B C IgM: NONREACTIVE
Hepatitis B Surface Ag: NONREACTIVE
SIGNAL TO CUT-OFF: 0.05 (ref <1.00)

## 2018-09-13 LAB — HEPATITIS B DNA, ULTRAQUANTITATIVE, PCR
Hepatitis B DNA (Calc): <1 Log IU/mL
Hepatitis B DNA: <10 IU/mL

## 2018-09-13 LAB — REFLEX TIQ

## 2018-09-13 LAB — HEPATITIS B E ANTIBODY: HEP B E AB: NONREACTIVE

## 2018-09-15 NOTE — Telephone Encounter (Signed)
Source Subject Topic  [name], Brent Cook (Patient) [name] Brent Cook (Patient) General - Other  Reason for CRM: Brent Cook called and stated that she had ordered some labs but did not receive hep EAG. Please advise [phone]  Patient Information   Patient Name Gender DOB SSN  [name], [name] Male [birth year] [id number]  Contacts    Type Contact Phone  09/15/2018 02:30 PM Phone (Incoming) [place] (Other) [phone]

## 2018-09-16 ENCOUNTER — Telehealth: Payer: Self-pay

## 2018-09-16 ENCOUNTER — Ambulatory Visit: Payer: Self-pay | Admitting: *Deleted

## 2018-09-16 NOTE — Telephone Encounter (Signed)
Summary: lab result    Joellen Jersey called to find out the results of the missing lab for Pt  Missing the Hep B e AG result       Reason for Disposition . Lab or radiology calling with test results    Lab calling for lab results  Answer Assessment - Initial Assessment Questions 1. REASON FOR CALL or QUESTION: "What is your reason for calling today?" or "How can I best help you?" or "What question do you have that I can help answer?"     Hep B e AG 2. CALLER: Document the source of call. (e.g., laboratory, patient).     Katie- [facility]  Protocols used: PCP CALL - NO TRIAGE-A-AH

## 2018-09-16 NOTE — Telephone Encounter (Signed)
Copied from [place] [id number]. Topic: General - Other >> Sep 15, 2018  2:28 PM Rayann Heman wrote: Reason for CRM: Katie from [place] [facility] called and stated that she had ordered some labs but did not receive hep EAG. Please advise [phone]

## 2018-09-16 NOTE — Telephone Encounter (Signed)
Spoke with Brent Cook and she informed me that the Hep B EAG wasn't done and stated that they needed this last test to do the transplant. Spoke with patient and gave him the information about the lab and he stated to let him pray on it and will let us know because he has been giving so much blood lately.

## 2018-09-16 NOTE — Telephone Encounter (Signed)
Copied from [place] [id number]. Topic: General - Other >> Sep 15, 2018  2:28 PM Rayann Heman wrote: Reason for CRM: Katie from [place] [facility] called and stated that she had ordered some labs but did not receive hep EAG. Please advise [phone] >> Sep 16, 2018  3:02 PM Yvette Rack wrote: Joellen Jersey with [facility] called in for [place]. Katie requests a call back at [phone]

## 2018-09-16 NOTE — Telephone Encounter (Signed)
Patient informed that lab will be placed and he can come into the office to have it done.

## 2018-09-17 ENCOUNTER — Other Ambulatory Visit: Payer: Self-pay | Admitting: Family Medicine

## 2018-09-17 DIAGNOSIS — I10 Essential (primary) hypertension: Secondary | ICD-10-CM

## 2018-09-17 NOTE — Telephone Encounter (Signed)
° °  Katie with [facility] ask that Noelle Penner call her concerning this patient    [phone]

## 2018-09-18 NOTE — Telephone Encounter (Signed)
Katie with [facility] ask that [place] call her concerning this patient. Phone # [phone]. I advised her that pt was notified for additional labs and is thinking about it.

## 2018-09-19 NOTE — Telephone Encounter (Signed)
Spoke with Joellen Jersey and she stated that she talked to patient and he has decided to not donate the stem cells to his sister.  No further assistance needed at this time.

## 2018-09-19 NOTE — Telephone Encounter (Signed)
No further recommendations at this time. Many of these medications are not covered by most insurance.  Thanks, BJ

## 2018-09-19 NOTE — Telephone Encounter (Signed)
Fax received from [place] stating the request was denied and this was given to Dr Doug Sou asst.

## 2018-09-19 NOTE — Telephone Encounter (Signed)
FYI sent to Dr. Martinique. PA for Cialis denied.

## 2018-09-26 ENCOUNTER — Other Ambulatory Visit (INDEPENDENT_AMBULATORY_CARE_PROVIDER_SITE_OTHER): Payer: 59

## 2018-09-26 ENCOUNTER — Other Ambulatory Visit: Payer: Self-pay | Admitting: *Deleted

## 2018-09-26 DIAGNOSIS — Z1159 Encounter for screening for other viral diseases: Secondary | ICD-10-CM

## 2018-09-26 NOTE — Telephone Encounter (Signed)
Patient showed up in office today to get blood draw. After speaking with patient and telling him the last conversation that I had with Joellen Jersey, he stated that he told her that he needed a break, not that he wasn't going to do it. Patient stated that he was tired of giving all that blood and that he didn't know the whole process would be as stressful as it is. Patient stated that he wanted to go ahead and give the blood. Called Katie from [place] and left her a message to return my call concerning patient.

## 2018-09-29 LAB — HEPATITIS B E ANTIGEN: Hep B E Ag: NONREACTIVE

## 2018-09-29 NOTE — Telephone Encounter (Signed)
Spoke with Joellen Jersey and she stated that she could get results from care every from patient's chart. No further assistance needed.

## 2018-10-07 DIAGNOSIS — R9431 Abnormal electrocardiogram [ECG] [EKG]: Secondary | ICD-10-CM | POA: Diagnosis not present

## 2018-10-24 ENCOUNTER — Other Ambulatory Visit: Payer: Self-pay | Admitting: Family Medicine

## 2018-10-24 DIAGNOSIS — R0981 Nasal congestion: Secondary | ICD-10-CM

## 2018-10-24 DIAGNOSIS — J309 Allergic rhinitis, unspecified: Secondary | ICD-10-CM

## 2018-10-30 ENCOUNTER — Telehealth: Payer: Self-pay | Admitting: Family Medicine

## 2018-10-30 NOTE — Telephone Encounter (Signed)
Copied from [place] [id number]. Topic: Quick Communication - Rx Refill/Question >> Oct 30, 2018  3:49 PM Margot Ables wrote: Medication: CIALIS 20 MG tablet  - pt insurance will only cover the generic now - pt states pharmacy has been contacting office for a couple of months to change the RX  Has the patient contacted their pharmacy? yes Preferred Pharmacy (with phone number or street name): [id number] [place], [place] [address] [phone] (Phone) [phone] (Fax)

## 2018-10-31 NOTE — Telephone Encounter (Signed)
PA for Cialis 20 mg was denied in 09/2018. Patient stated that insurance will cover Generic for Cialis 20 mg.

## 2018-11-04 NOTE — Telephone Encounter (Signed)
Generic for Cialis, (Tadalafil) 20 mg to take once daily prn (#30/3) can be called in to his pharmacy as requested. Please explain patient that he can request generic from his pharmacist, if there is one available. I am not certain generic Cialis is covered by his insurance.  Thanks, BJ

## 2018-11-05 ENCOUNTER — Other Ambulatory Visit: Payer: Self-pay | Admitting: *Deleted

## 2018-11-05 MED ORDER — TADALAFIL 20 MG PO TABS: 20.0000 mg | ORAL_TABLET | Freq: Every day | ORAL | 3 refills | Status: DC | PRN

## 2018-11-05 NOTE — Telephone Encounter (Signed)
Rx for generic sent to pharmacy as requested.

## 2018-11-06 ENCOUNTER — Encounter: Payer: Self-pay | Admitting: Internal Medicine

## 2018-11-06 ENCOUNTER — Ambulatory Visit (INDEPENDENT_AMBULATORY_CARE_PROVIDER_SITE_OTHER): Payer: 59 | Admitting: Internal Medicine

## 2018-11-06 VITALS — BP 120/78 | HR 82 | Temp 98.2°F | Wt 208.8 lb

## 2018-11-06 DIAGNOSIS — J069 Acute upper respiratory infection, unspecified: Secondary | ICD-10-CM | POA: Diagnosis not present

## 2018-11-06 NOTE — Patient Instructions (Signed)
-  It was nice meeting you today and I hope you feel better soon!  -May use over the counter remedies: tylenol sinus, cough syrup, afrin for up to 5 days.  -Please come back to see [place] if no significant improvement over the next 7-10 days.

## 2018-11-06 NOTE — Progress Notes (Signed)
Established Patient Office Visit     CC/Reason for Visit: HA, sinus pressure  HPI: Brent Cook is a 58 y.o. male who is coming in today for the above mentioned reasons. For 2 days he has been having a frontal HA, worse when bending down and sinus pressure. He has felt feverish but has not taken his temperature. No fever in the office. No runny nose, ear pain but he has had a scratchy, sore throat. No sick contacts or recent travel.   Past Medical/Surgical History: Past Medical History:  Diagnosis Date  . Arthritis    left knee  . Asthma    as child  . Diabetes mellitus    type 2  . Erectile dysfunction   . Hypertension     Past Surgical History:  Procedure Laterality Date  . meniscus tear     pt. reported 10-15 years ago  . TONSILLECTOMY     childhood  . 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 WISDOM TOOTH EXTRACTION     with sedation    Social History:  reports that he has never smoked. He has never used smokeless tobacco. He reports that he does not drink alcohol or use drugs.  Allergies: Allergies  Allergen Reactions  . Other     Family History:  Family History  Problem Relation Age of Onset  . Cancer Father   . Colon cancer Father        dx'd late 25's early 3's  . Cancer Mother   . Cancer Maternal Grandfather   . Colon polyps Neg Hx   . Rectal cancer Neg Hx   . Stomach cancer Neg Hx      Current Outpatient Medications:  .  fluticasone (FLONASE) 50 MCG/ACT nasal spray, SPRAY 2 SPRAYS INTO EACH NOSTRIL EVERY DAY, Disp: 16 g, Rfl: 2 .  metFORMIN (GLUCOPHAGE-XR) 500 MG 24 hr tablet, Take 1 tablet (500 mg total) by mouth at bedtime., Disp: 90 tablet, Rfl: 2 .  olmesartan-hydrochlorothiazide (BENICAR HCT) 20-12.5 MG tablet, TAKE 1 TABLET BY MOUTH EVERY DAY, Disp: 90 tablet, Rfl: 1 .  tadalafil  (ADCIRCA/CIALIS) 20 MG tablet, Take 1 tablet (20 mg total) by mouth daily as needed for erectile dysfunction., Disp: 30 tablet, Rfl: 3  Review of Systems:  Constitutional: Denies chills, diaphoresis, appetite change and fatigue.  HEENT: Denies photophobia, eye pain, redness, hearing loss, ear pain,  sneezing, mouth sores, trouble swallowing, neck pain, neck stiffness and tinnitus.  Respiratory: Denies SOB, DOE, cough, chest tightness,  and wheezing.   Cardiovascular: Denies chest pain, palpitations and leg swelling.    Physical Exam: Vitals:   11/06/18 1032  BP: 120/78  Pulse: 82  Temp: 98.2 F (36.8 C)  TempSrc: Oral  SpO2: 96%  Weight: 208 lb 12.8 oz (94.7 kg)    Body mass index is 32.7 kg/m.   Constitutional: NAD, calm, comfortable Eyes: PERRL, lids and conjunctivae normal ENMT: Mucous membranes are moist. Posterior pharynx is erythematous but clear of any exudate or lesions. Normal dentition. Tympanic membrane is pearly white bilaterally, no erythema or bulging. Neck: normal, supple, no masses, no thyromegaly Respiratory: clear to auscultation bilaterally, no wheezing, no crackles. Normal respiratory effort. No accessory muscle use.  Cardiovascular: Regular rate and rhythm, no murmurs / rubs / gallops. No extremity edema. 2+ pedal pulses. No carotid bruits.  Impression and Plan:  Upper respiratory tract infection, unspecified type -Likely viral, so no abx prescribed today. -Advised OTC measures and RTC if no improvement in 7-10 days.    Patient Instructions  -It was nice meeting you today and I hope you feel better soon!  -May use over the counter remedies: tylenol sinus, cough syrup, afrin for up to 5 days.  -Please come back to see [place] if no significant improvement over the next 7-10 days.     Lelon Frohlich, MD [facility]

## 2018-12-26 ENCOUNTER — Ambulatory Visit (INDEPENDENT_AMBULATORY_CARE_PROVIDER_SITE_OTHER): Payer: 59 | Admitting: Family Medicine

## 2018-12-26 ENCOUNTER — Other Ambulatory Visit: Payer: Self-pay

## 2018-12-26 ENCOUNTER — Encounter: Payer: Self-pay | Admitting: Family Medicine

## 2018-12-26 VITALS — BP 122/84 | HR 87 | Temp 97.9°F | Resp 12 | Ht 67.0 in | Wt 209.1 lb

## 2018-12-26 DIAGNOSIS — E119 Type 2 diabetes mellitus without complications: Secondary | ICD-10-CM | POA: Diagnosis not present

## 2018-12-26 DIAGNOSIS — I10 Essential (primary) hypertension: Secondary | ICD-10-CM

## 2018-12-26 DIAGNOSIS — N529 Male erectile dysfunction, unspecified: Secondary | ICD-10-CM

## 2018-12-26 DIAGNOSIS — R7989 Other specified abnormal findings of blood chemistry: Secondary | ICD-10-CM | POA: Diagnosis not present

## 2018-12-26 LAB — TESTOSTERONE: Testosterone: 165.16 ng/dL — ABNORMAL LOW (ref 300.00–890.00)

## 2018-12-26 LAB — HEMOGLOBIN A1C: Hgb A1c MFr Bld: 5.9 % (ref 4.6–6.5)

## 2018-12-26 LAB — TSH: TSH: 1.5 u[IU]/mL (ref 0.35–4.50)

## 2018-12-26 LAB — MICROALBUMIN / CREATININE URINE RATIO
Creatinine,U: 67.3 mg/dL
Microalb Creat Ratio: 9.4 mg/g (ref 0.0–30.0)
Microalb, Ur: 6.3 mg/dL — ABNORMAL HIGH (ref 0.0–1.9)

## 2018-12-26 MED ORDER — VARDENAFIL HCL 20 MG PO TABS: 20.0000 mg | ORAL_TABLET | Freq: Every day | ORAL | 0 refills | Status: DC | PRN

## 2018-12-26 NOTE — Patient Instructions (Addendum)
A few things to remember from today's visit:   Controlled type 2 diabetes mellitus without complication, without long-term current use of insulin ([place]) - Plan: Basic metabolic panel, Hemoglobin A1c, Microalbumin / creatinine urine ratio  Essential hypertension - Plan: Basic metabolic panel  Erectile dysfunction, unspecified erectile dysfunction type - Plan: TSH, Testosterone   Erectile Dysfunction Erectile dysfunction (ED) is the inability to get or keep an erection in order to have sexual intercourse. Erectile dysfunction may include:  Inability to get an erection.  Lack of enough hardness of the erection to allow penetration.  Loss of the erection before sex is finished. What are the causes? This condition may be caused by:  Certain medicines, such as: ? Pain relievers. ? Antihistamines. ? Antidepressants. ? Blood pressure medicines. ? Water pills (diuretics). ? Ulcer medicines. ? Muscle relaxants. ? Drugs.  Excessive drinking.  Psychological causes, such as: ? Anxiety. ? Depression. ? Sadness. ? Exhaustion. ? Performance fear. ? Stress.  Physical causes, such as: ? Artery problems. This may include diabetes, smoking, liver disease, or atherosclerosis. ? High blood pressure. ? Hormonal problems, such as low testosterone. ? Obesity. ? Nerve problems. This may include back or pelvic injuries, diabetes mellitus, multiple sclerosis, or Parkinson disease. What are the signs or symptoms? Symptoms of this condition include:  Inability to get an erection.  Lack of enough hardness of the erection to allow penetration.  Loss of the erection before sex is finished.  Normal erections at some times, but with frequent unsatisfactory episodes.  Low sexual satisfaction in either partner due to erection problems.  A curved penis occurring with erection. The curve may cause pain or the penis may be too curved to allow for intercourse.  Never having nighttime erections.  How is this diagnosed? This condition is often diagnosed by:  Performing a physical exam to find other diseases or specific problems with the penis.  Asking you detailed questions about the problem.  Performing blood tests to check for diabetes mellitus or to measure hormone levels.  Performing other tests to check for underlying health conditions.  Performing an ultrasound exam to check for scarring.  Performing a test to check blood flow to the penis.  Doing a sleep study at home to measure nighttime erections. How is this treated? This condition may be treated by:  Medicine taken by mouth to help you achieve an erection (oral medicine).  Hormone replacement therapy to replace low testosterone levels.  Medicine that is injected into the penis. Your health care provider may instruct you how to give yourself these injections at home.  Vacuum pump. This is a pump with a ring on it. The pump and ring are placed on the penis and used to create pressure that helps the penis become erect.  Penile implant surgery. In this procedure, you may receive: ? An inflatable implant. This consists of cylinders, a pump, and a reservoir. The cylinders can be inflated with a fluid that helps to create an erection, and they can be deflated after intercourse. ? A semi-rigid implant. This consists of two silicone rubber rods. The rods provide some rigidity. They are also flexible, so the penis can both curve downward in its normal position and become straight for sexual intercourse.  Blood vessel surgery, to improve blood flow to the penis. During this procedure, a blood vessel from a different part of the body is placed into the penis to allow blood to flow around (bypass) damaged or blocked blood vessels.  Lifestyle  changes, such as exercising more, losing weight, and quitting smoking. Follow these instructions at home: Medicines   Take over-the-counter and prescription medicines only as told by  your health care provider. Do not increase the dosage without first discussing it with your health care provider.  If you are using self-injections, perform injections as directed by your health care provider. Make sure to avoid any veins that are on the surface of the penis. After giving an injection, apply pressure to the injection site for 5 minutes. General instructions  Exercise regularly, as directed by your health care provider. Work with your health care provider to lose weight, if needed.  Do not use any products that contain nicotine or tobacco, such as cigarettes and e-cigarettes. If you need help quitting, ask your health care provider.  Before using a vacuum pump, read the instructions that come with the pump and discuss any questions with your health care provider.  Keep all follow-up visits as told by your health care provider. This is important. Contact a health care provider if:  You feel nauseous.  You vomit. Get help right away if:  You are taking oral or injectable medicines and you have an erection that lasts longer than 4 hours. If your health care provider is unavailable, go to the nearest emergency room for evaluation. An erection that lasts much longer than 4 hours can result in permanent damage to your penis.  You have severe pain in your groin or abdomen.  You develop redness or severe swelling of your penis.  You have redness spreading up into your groin or lower abdomen.  You are unable to urinate.  You experience chest pain or a rapid heart beat (palpitations) after taking oral medicines. Summary  Erectile dysfunction (ED) is the inability to get or keep an erection during sexual intercourse. This problem can usually be treated successfully.  This condition is diagnosed based on a physical exam, your symptoms, and tests to determine the cause. Treatment varies depending on the cause, and may include medicines, hormone therapy, surgery, or vacuum pump.   You may need follow-up visits to make sure that you are using your medicines or devices correctly.  Get help right away if you are taking or injecting medicines and you have an erection that lasts longer than 4 hours. This information is not intended to replace advice given to you by your health care provider. Make sure you discuss any questions you have with your health care provider. Document Released: 09/21/2000 Document Revised: 10/10/2016 Document Reviewed: 10/10/2016 Elsevier Interactive Patient Education  2019 [employer].  Please be sure medication list is accurate. If a new problem present, please set up appointment sooner than planned today.

## 2018-12-26 NOTE — Assessment & Plan Note (Signed)
BP adequately controlled. No changes in current management. Continue low salt diet. 

## 2018-12-26 NOTE — Assessment & Plan Note (Signed)
We discussed possible etiologies, including endocrine, medications, as well as chronic illnesses, especially poorly controlled the DM. He would like to see an urologist, referral placed. He also wants to try Levitra, side effects reviewed.  Further recommendation will be given according to lab results.

## 2018-12-26 NOTE — Progress Notes (Signed)
HPI:  Chief Complaint  Patient presents with  . Discuss medication    Brent Cook is a 58 y.o. male, who is here today concerned about erectile dysfunction. He has had problem for years. He tried Viagra and Cialis, he did not think it was "strong enough." Medication usually starts working around 5 to 6 hours after taking. No side effect from medication.  He has not identified exacerbating or alleviating factors.  Denies decreased libido, depressed, mood or fatigue. He denies urinary symptoms.  Lab Results  Component Value Date   PSA 1.39 04/07/2018   Some of his chronic problems include hypertension and DM2. Last follow-up was in 04/2018. He is on metformin XR 500 once daily.  FG 140's. Denies abdominal pain, nausea,vomiting, polydipsia,polyuria, or polyphagia.   Lab Results  Component Value Date   MICROALBUR 57.8 (H) 04/07/2018   Lab Results  Component Value Date   HGBA1C 7.1 (H) 04/07/2018   HTN: Currently he is on Benicar HCT 20-12.5 mg daily. He is not checking BP periodically. Denies unusual headache, chest pain, dyspnea, palpitations, or edema.  Lab Results  Component Value Date   CREATININE 1.18 09/11/2018   BUN 21 09/11/2018   NA 138 09/11/2018   K 3.8 09/11/2018   CL 101 09/11/2018   CO2 28 09/11/2018    Review of Systems  Constitutional: Negative for activity change, appetite change, fatigue and fever.  HENT: Negative for nosebleeds and sore throat.   Eyes: Negative for redness and visual disturbance.  Respiratory: Negative for cough, shortness of breath and wheezing.   Cardiovascular: Negative for chest pain, palpitations and leg swelling.  Gastrointestinal: Negative for abdominal pain, nausea and vomiting.  Endocrine: Negative for polydipsia, polyphagia and polyuria.  Genitourinary: Negative for decreased urine volume, difficulty urinating, dysuria, genital sores, hematuria and testicular pain.  Neurological: Negative for  syncope, weakness and headaches.    Current Outpatient Medications on File Prior to Visit  Medication Sig Dispense Refill  . fluticasone (FLONASE) 50 MCG/ACT nasal spray SPRAY 2 SPRAYS INTO EACH NOSTRIL EVERY DAY 16 g 2  . metFORMIN (GLUCOPHAGE-XR) 500 MG 24 hr tablet Take 1 tablet (500 mg total) by mouth at bedtime. 90 tablet 2  . olmesartan-hydrochlorothiazide (BENICAR HCT) 20-12.5 MG tablet TAKE 1 TABLET BY MOUTH EVERY DAY 90 tablet 1   No current facility-administered medications on file prior to visit.      Past Medical History:  Diagnosis Date  . Arthritis    left knee  . Asthma    as child  . Diabetes mellitus    type 2  . Erectile dysfunction   . Hypertension    Allergies  Allergen Reactions  . Other     Social History   Socioeconomic History  . Marital status: Married    Spouse name: Not on file  . Number of children: Not on file  . Years of education: Not on file  . Highest education level: Not on file  Occupational History    Employer: [employer]  Social Needs  . Financial resource strain: Not on file  . Food insecurity:    Worry: Not on file    Inability: Not on file  . Transportation needs:    Medical: Not on file    Non-medical: Not on file  Tobacco Use  . Smoking status: Never Smoker  . Smokeless tobacco: Never Used  Substance and Sexual Activity  . Alcohol use: No    Alcohol/week: 0.0  standard drinks  . Drug use: No  . Sexual activity: Not on file  Lifestyle  . Physical activity:    Days per week: Not on file    Minutes per session: Not on file  . Stress: Not on file  Relationships  . Social connections:    Talks on phone: Not on file    Gets together: Not on file    Attends religious service: Not on file    Active member of club or organization: Not on file    Attends meetings of clubs or organizations: Not on file    Relationship status: Not on file  Other Topics Concern  . Not on file  Social History Narrative  . Not on file     Vitals:   12/26/18 1000  BP: 122/84  Pulse: 87  Resp: 12  Temp: 97.9 F (36.6 C)  SpO2: 97%   Body mass index is 32.75 kg/m.   Physical Exam  Nursing note and vitals reviewed. Constitutional: He is oriented to person, place, and time. He appears well-developed. No distress.  HENT:  Head: Normocephalic and atraumatic.  Mouth/Throat: Oropharynx is clear and moist and mucous membranes are normal.  Eyes: Pupils are equal, round, and reactive to light. Conjunctivae are normal.  Cardiovascular: Normal rate and regular rhythm.  No murmur heard. Pulses:      Dorsalis pedis pulses are 2+ on the right side and 2+ on the left side.  Respiratory: Effort normal and breath sounds normal. No respiratory distress.  GI: Soft. He exhibits no mass. There is no hepatomegaly. There is no abdominal tenderness.  Musculoskeletal:        General: No edema.  Lymphadenopathy:    He has no cervical adenopathy.  Neurological: He is alert and oriented to person, place, and time. He has normal strength. No cranial nerve deficit. Gait normal.  Skin: Skin is warm. No rash noted. No erythema.  Psychiatric: He has a normal mood and affect.  Well groomed, good eye contact.   Diabetic Foot Exam - Simple   Simple Foot Form Diabetic Foot exam was performed with the following findings:  Yes 12/26/2018 10:36 AM  Visual Inspection No deformities, no ulcerations, no other skin breakdown bilaterally:  Yes Sensation Testing Intact to touch and monofilament testing bilaterally:  Yes Pulse Check Posterior Tibialis and Dorsalis pulse intact bilaterally:  Yes Comments     ASSESSMENT AND PLAN:  Brent Cook was seen today for discuss medication.  Orders Placed This Encounter  Procedures  . Hemoglobin A1c  . TSH  . Testosterone  . Microalbumin / creatinine urine ratio  . Ambulatory referral to Urology   Lab Results  Component Value Date   MICROALBUR 6.3 (H) 12/26/2018   Lab Results  Component Value Date    TSH 1.50 12/26/2018   Lab Results  Component Value Date   HGBA1C 5.9 12/26/2018   Lab Results  Component Value Date   TESTOSTERONE 165.16 (L) 12/26/2018    Erectile dysfunction We discussed possible etiologies, including endocrine, medications, as well as chronic illnesses, especially poorly controlled the DM. He would like to see an urologist, referral placed. He also wants to try Levitra, side effects reviewed.  Further recommendation will be given according to lab results.  Essential hypertension BP adequately controlled. No changes in current management. Continue low-salt diet.   Diabetes mellitus type II, controlled, with no complications ([place]) PPJ0D has not been at goal. No changes in current management, which will be adjusted depending  on A1c results. Regular exercise and healthy diet with avoidance of added sugar food intake is an important part of treatment and recommended. Annual eye exam, periodic dental and foot care recommended. F/U in 5-6 months   Return in about 5 months (around 05/28/2019) for DM II.     G. Martinique, MD  [facility] [place] office.

## 2018-12-26 NOTE — Assessment & Plan Note (Signed)
HgA1C has not been at goal. No changes in current management, which will be adjusted depending on A1c results. Regular exercise and healthy diet with avoidance of added sugar food intake is an important part of treatment and recommended. Annual eye exam, periodic dental and foot care recommended. F/U in 5-6 months

## 2019-01-09 NOTE — Addendum Note (Signed)
Addended by: Gwenyth Ober R on: 01/09/2019 11:23 AM   Modules accepted: Orders

## 2019-01-24 ENCOUNTER — Other Ambulatory Visit: Payer: Self-pay | Admitting: Family Medicine

## 2019-01-24 DIAGNOSIS — R0981 Nasal congestion: Secondary | ICD-10-CM

## 2019-01-24 DIAGNOSIS — J309 Allergic rhinitis, unspecified: Secondary | ICD-10-CM

## 2019-01-26 IMAGING — DX DG KNEE 1-2V PORT*L*
2 series · 2 of 2 positions shown · non-contrast
Comparison: No prior.

CLINICAL DATA: Knee replacement.

EXAM:
PORTABLE LEFT KNEE - 1-2 VIEW

[knee ap]
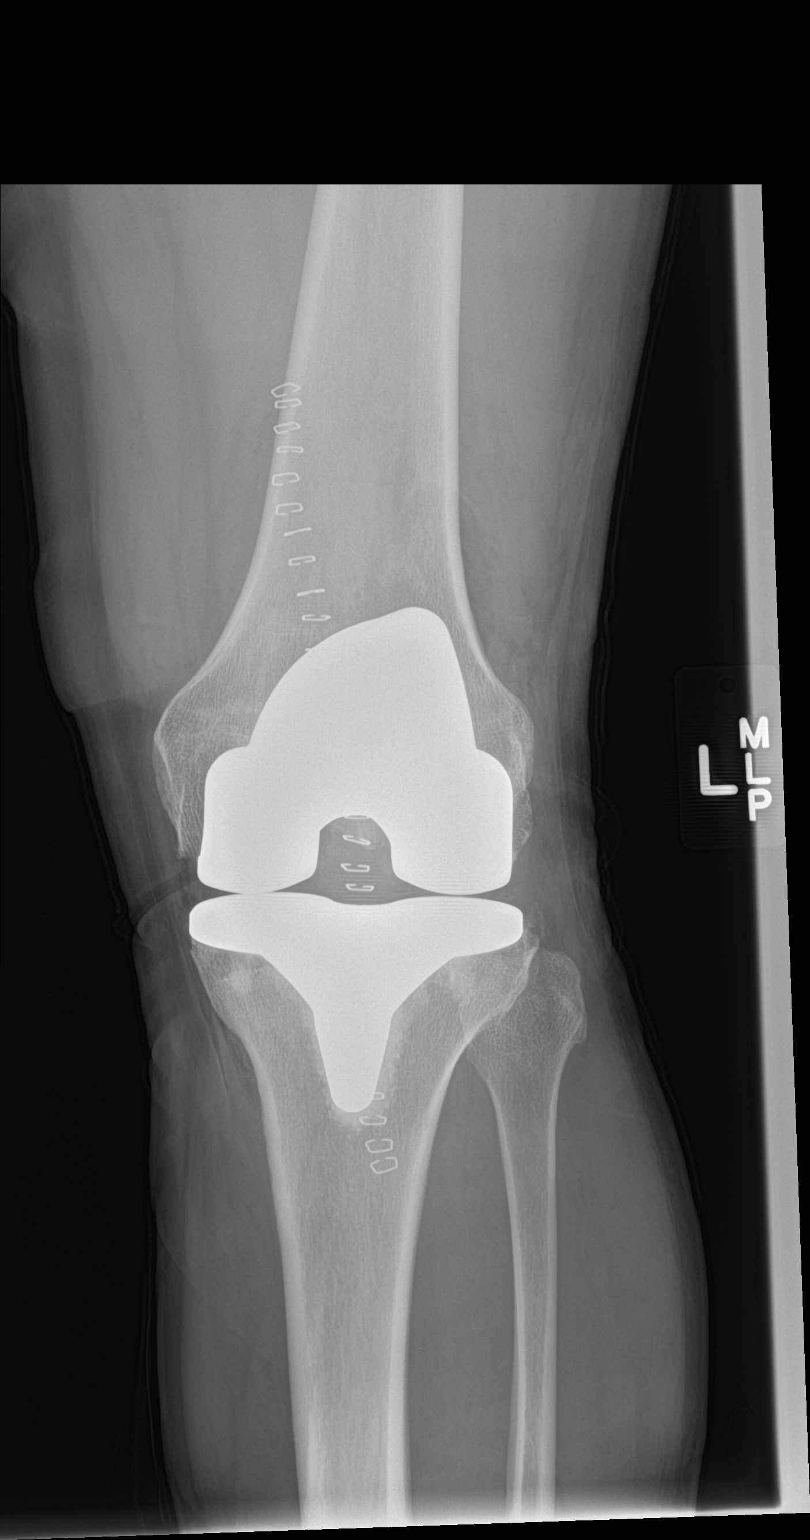

[knee lat]
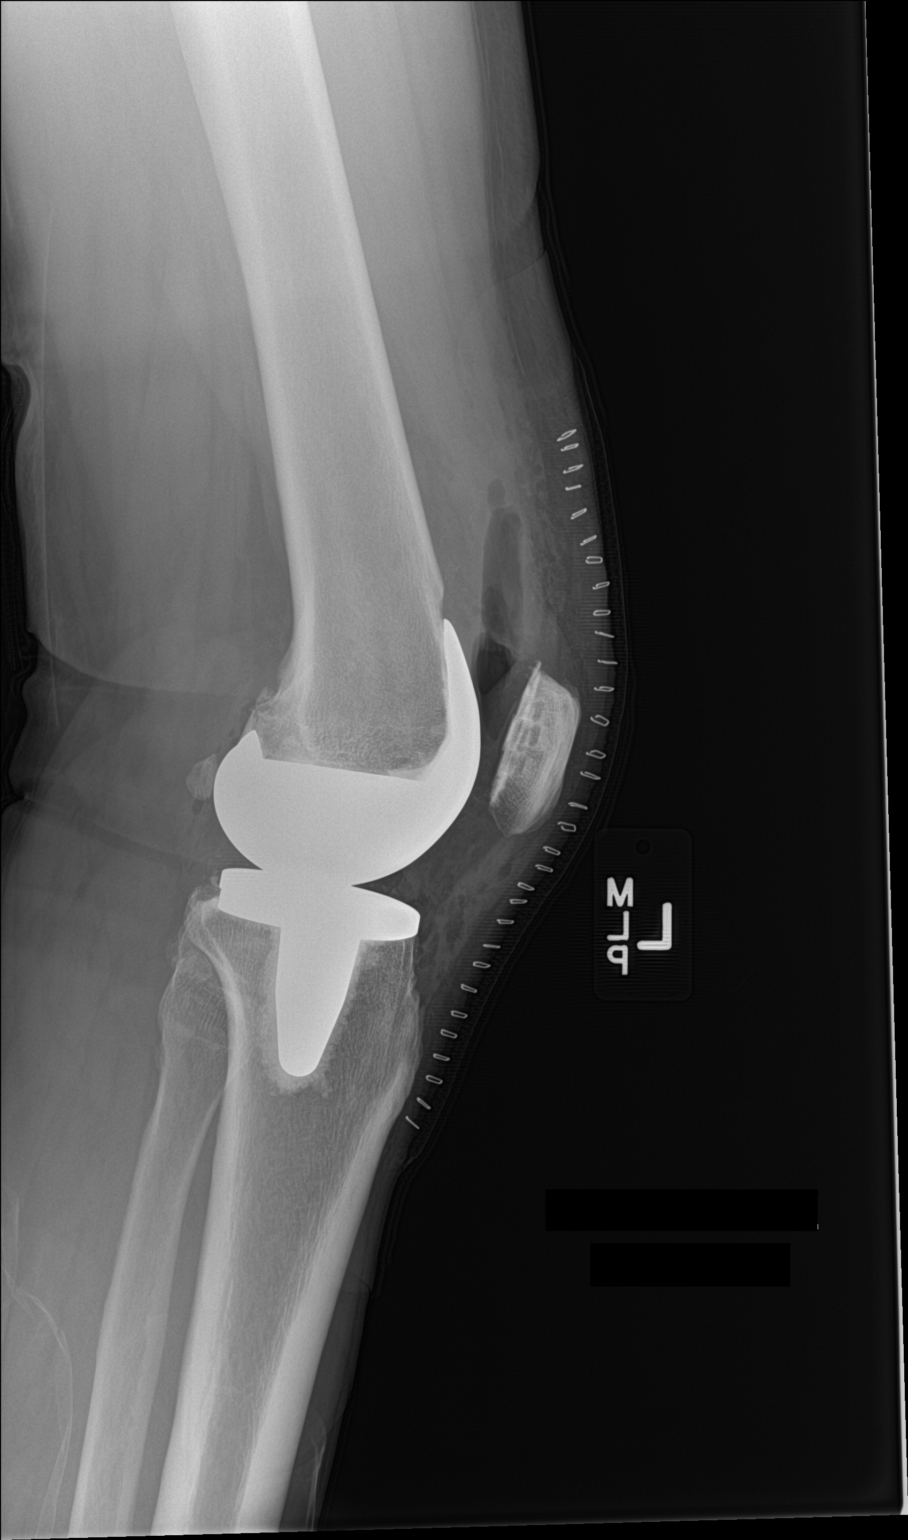

[2 of 2 positions shown; findings below may reference images not displayed]

FINDINGS: Total left knee replacement.  Hardware intact.  Anatomic alignment.
IMPRESSION: Total left knee replacement with good anatomic alignment.

## 2019-02-06 ENCOUNTER — Other Ambulatory Visit: Payer: Self-pay | Admitting: *Deleted

## 2019-02-06 ENCOUNTER — Telehealth: Payer: Self-pay | Admitting: *Deleted

## 2019-02-06 MED ORDER — TADALAFIL 20 MG PO TABS: 20.0000 mg | ORAL_TABLET | Freq: Every day | ORAL | 0 refills | Status: DC | PRN

## 2019-02-06 NOTE — Telephone Encounter (Signed)
Rx for Taladafil 20 mg to continue one daily prn can be sent. Thanks, BJ

## 2019-02-06 NOTE — Telephone Encounter (Signed)
Cialis 20 mg tablet, alternative requested, patient requests new Rx for generic for Cialis

## 2019-02-06 NOTE — Telephone Encounter (Signed)
Rx sent to pharmacy per Dr. Doug Sou orders.

## 2019-02-25 ENCOUNTER — Telehealth: Payer: Self-pay | Admitting: *Deleted

## 2019-02-25 NOTE — Telephone Encounter (Signed)
Patient requests refill of Tadalafil 20 mg tablet

## 2019-03-26 ENCOUNTER — Other Ambulatory Visit: Payer: Self-pay | Admitting: Family Medicine

## 2019-04-06 ENCOUNTER — Other Ambulatory Visit: Payer: Self-pay | Admitting: Family Medicine

## 2019-04-06 DIAGNOSIS — I10 Essential (primary) hypertension: Secondary | ICD-10-CM

## 2019-04-19 NOTE — Progress Notes (Signed)
HPI:  Mr. Brent Cook is a 58 y.o.male here today for his routine physical examination. Last OV 12/26/18.  Last CPE: 04/07/18 He goes to the [place], his PCP, he has not been there for at least 6 months. He lives with his wife.  Regular exercise 3 or more times per week: He has not any consistently. Following a healthy diet: Not consistently.  Chronic medical problems: ED,HTN,DM II,and HLD among some.  Hx of STD's: Denies.  Immunization History  Administered Date(s) Administered  . Influenza,inj,Quad PF,6+ Mos 10/12/2014  . Pneumococcal Conjugate-13 06/04/2014  . Pneumococcal Polysaccharide-23 10/12/2014  . Td 10/08/2001  . Tdap 11/07/2011   -Hep C screening: 08/2018 as part of screening for stem cell donor to her sister.  Sadly she died a few weeks ago, he is not sure about type of cancer.  Last colon cancer screening: 11/2015. Last prostate ca screening:   Lab Results  Component Value Date   PSA 1.39 04/07/2018   PSA 1.02 06/20/2015   PSA 1.09 05/28/2014   Denies dysuria,increased urinary frequency, gross hematuria,or decreased urine output. Nocturia x 0.  -Denies high alcohol intake, tobacco use, or Hx of illicit drug use.  -Concerns and/or follow up today:   HLD He is not on statin medication.  Lab Results  Component Value Date   CHOL 130 04/07/2018   HDL 29.60 (L) 04/07/2018   LDLCALC 78 04/07/2018   TRIG 110.0 04/07/2018   CHOLHDL 4 04/07/2018   DM II: He is on Metformin XR 500 mg daily. BS's 130's.  Lab Results  Component Value Date   HGBA1C 5.9 12/26/2018   Lab Results  Component Value Date   MICROALBUR 6.3 (H) 12/26/2018   HTN: He is on Benicar HCT 20-12.5 mg daily. He is not checking BP at home.   Lab Results  Component Value Date   CREATININE 1.18 09/11/2018   BUN 21 09/11/2018   NA 138 09/11/2018   K 3.8 09/11/2018   CL 101 09/11/2018   CO2 28 09/11/2018     Review of Systems  Constitutional: Negative for activity  change, appetite change, fatigue, fever and unexpected weight change.  HENT: Negative for dental problem, nosebleeds, sore throat, trouble swallowing and voice change.   Eyes: Negative for redness and visual disturbance.  Respiratory: Negative for cough, shortness of breath and wheezing.   Cardiovascular: Negative for chest pain, palpitations and leg swelling.  Gastrointestinal: Negative for abdominal pain, blood in stool, nausea and vomiting.  Endocrine: Negative for cold intolerance, heat intolerance, polydipsia, polyphagia and polyuria.  Genitourinary: Negative for decreased urine volume, dysuria, genital sores, hematuria and testicular pain.  Musculoskeletal: Negative for gait problem and joint swelling.  Skin: Negative for color change and rash.  Allergic/Immunologic: Positive for environmental allergies.  Neurological: Negative for dizziness, syncope, weakness, numbness and headaches.  Hematological: Negative for adenopathy. Does not bruise/bleed easily.  Psychiatric/Behavioral: Negative for confusion and sleep disturbance. The patient is not nervous/anxious.   All other systems reviewed and are negative.    Current Outpatient Medications on File Prior to Visit  Medication Sig Dispense Refill  . fluticasone (FLONASE) 50 MCG/ACT nasal spray SPRAY 2 SPRAYS INTO EACH NOSTRIL EVERY DAY 16 g 2  . olmesartan-hydrochlorothiazide (BENICAR HCT) 20-12.5 MG tablet TAKE 1 TABLET BY MOUTH EVERY DAY 90 tablet 1  . tadalafil (ADCIRCA/CIALIS) 20 MG tablet Take 1 tablet (20 mg total) by mouth daily as needed for erectile dysfunction. 10 tablet 0  . vardenafil (LEVITRA)  20 MG tablet Take 1 tablet (20 mg total) by mouth daily as needed for erectile dysfunction. 10 tablet 0   No current facility-administered medications on file prior to visit.      Past Medical History:  Diagnosis Date  . Arthritis    left knee  . Asthma    as child  . Diabetes mellitus    type 2  . Erectile dysfunction   .  Hypertension     Past Surgical History:  Procedure Laterality Date  . meniscus tear     pt. reported 10-15 years ago  . TONSILLECTOMY     childhood  . 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 WISDOM TOOTH EXTRACTION     with sedation    Allergies  Allergen Reactions  . Other     Family History  Problem Relation Age of Onset  . Cancer Father   . Colon cancer Father        dx'd late 9's early 25's  . Cancer Mother   . Cancer Maternal Grandfather   . Colon polyps Neg Hx   . Rectal cancer Neg Hx   . Stomach cancer Neg Hx     Social History   Socioeconomic History  . Marital status: Married    Spouse name: Not on file  . Number of children: Not on file  . Years of education: Not on file  . Highest education level: Not on file  Occupational History    Employer: [employer]  Social Needs  . Financial resource strain: Not on file  . Food insecurity    Worry: Not on file    Inability: Not on file  . Transportation needs    Medical: Not on file    Non-medical: Not on file  Tobacco Use  . Smoking status: Never Smoker  . Smokeless tobacco: Never Used  Substance and Sexual Activity  . Alcohol use: No    Alcohol/week: 0.0 standard drinks  . Drug use: No  . Sexual activity: Not on file  Lifestyle  . Physical activity    Days per week: Not on file    Minutes per session: Not on file  . Stress: Not on file  Relationships  . Social Herbalist on phone: Not on file    Gets together: Not on file    Attends religious service: Not on file    Active member of club or organization: Not on file    Attends meetings of clubs or organizations: Not on file    Relationship status: Not on file  Other Topics Concern  . Not on file  Social History Narrative  . Not on file     Vitals:   04/20/19 0838 04/20/19 0903  BP: (!)  140/92 135/80  Pulse: 80   Resp: 12   Temp: 98 F (36.7 C)   SpO2: 98%    Body mass index is 31.79 kg/m.   Wt Readings from Last 3 Encounters:  04/20/19 203 lb (92.1 kg)  12/26/18 209 lb 2 oz (94.9 kg)  11/06/18 208 lb 12.8 oz (94.7 kg)    Physical Exam  Nursing note and vitals reviewed. Constitutional: He is oriented to person, place, and time. He appears well-developed. No distress.  HENT:  Head: Atraumatic.  Right Ear: Tympanic membrane, external ear and ear canal normal.  Left Ear: Tympanic membrane, external ear and ear canal normal.  Mouth/Throat: Oropharynx is clear and moist and mucous membranes are normal.  Eyes: Pupils are equal, round, and reactive to light. Conjunctivae and EOM are normal.  Neck: Normal range of motion. No tracheal deviation present. No thyromegaly present.  Cardiovascular: Normal rate and regular rhythm.  No murmur heard. Pulses:      Dorsalis pedis pulses are 2+ on the right side and 2+ on the left side.  Respiratory: Effort normal and breath sounds normal. No respiratory distress.  GI: Soft. He exhibits no mass. There is no hepatomegaly. There is no abdominal tenderness.  Genitourinary:    Genitourinary Comments: Refused,no concerns.   Musculoskeletal:        General: No tenderness or edema.     Comments: No major deformities appreciated and no signs of synovitis.  Lymphadenopathy:    He has no cervical adenopathy.       Right: No supraclavicular adenopathy present.       Left: No supraclavicular adenopathy present.  Neurological: He is alert and oriented to person, place, and time. He has normal strength. No cranial nerve deficit or sensory deficit. Coordination and gait normal.  Reflex Scores:      Bicep reflexes are 2+ on the right side and 2+ on the left side.      Patellar reflexes are 2+ on the right side and 2+ on the left side. Skin: Skin is warm. No erythema.  Psychiatric: He has a normal mood and affect. Cognition and memory are  normal.  Well groomed,good eye contact.    ASSESSMENT AND PLAN:  Mr.Lovelle was seen today for annual exam.  Diagnoses and all orders for this visit:  Lab Results  Component Value Date   CHOL 121 04/20/2019   HDL 26.10 (L) 04/20/2019   LDLCALC 71 04/20/2019   TRIG 117.0 04/20/2019   CHOLHDL 5 04/20/2019   Lab Results  Component Value Date   HGBA1C 6.9 (H) 04/20/2019    Lab Results  Component Value Date   CREATININE 1.09 04/20/2019   BUN 22 04/20/2019   NA 138 04/20/2019   K 3.9 04/20/2019   CL 101 04/20/2019   CO2 27 04/20/2019   Lab Results  Component Value Date   MICROALBUR 30.8 (H) 04/20/2019   Lab Results  Component Value Date   PSA 1.49 04/20/2019    Routine general medical examination at a health care facility We discussed the importance of regular physical activity and healthy diet for prevention of chronic illness and/or complications. Preventive guidelines reviewed. Vaccination up to date.  Next CPE in a year.  Essential hypertension Recommend checking BP at home regularly. Today BP initially high. Low salt diet to continue. He is due for eye exam.  -     Basic metabolic panel  Controlled type 2 diabetes mellitus without complication, without long-term current use of insulin ([place]) HgA1C has been at goal. No changes in current management, will adjust according to A1C result. Regular exercise and healthy diet with avoidance of added sugar food intake is an important part of treatment and recommended. Annual eye exam, periodic dental and foot care recommended. F/U in 5-6 months  -     Hemoglobin A1c -     Basic metabolic panel -     Microalbumin / creatinine urine ratio  Pure hypercholesterolemia Educated about benefits as well as side effects of stain medications. Will will recommendations about type of statin and dose according to [place]  results.  -     Lipid panel  Prostate cancer screening -     PSA(Must document that pt has been  informed of limitations of PSA testing.)     Return in 6 months (on 10/21/2019) for F/U.    Betty G. Martinique, MD  [facility]. [place] office.

## 2019-04-20 ENCOUNTER — Ambulatory Visit (INDEPENDENT_AMBULATORY_CARE_PROVIDER_SITE_OTHER): Payer: 59 | Admitting: Family Medicine

## 2019-04-20 ENCOUNTER — Encounter: Payer: Self-pay | Admitting: Family Medicine

## 2019-04-20 VITALS — BP 135/80 | HR 80 | Temp 98.0°F | Resp 12 | Ht 67.0 in | Wt 203.0 lb

## 2019-04-20 DIAGNOSIS — E7800 Pure hypercholesterolemia, unspecified: Secondary | ICD-10-CM | POA: Diagnosis not present

## 2019-04-20 DIAGNOSIS — Z0000 Encounter for general adult medical examination without abnormal findings: Secondary | ICD-10-CM

## 2019-04-20 DIAGNOSIS — I10 Essential (primary) hypertension: Secondary | ICD-10-CM | POA: Diagnosis not present

## 2019-04-20 DIAGNOSIS — Z0001 Encounter for general adult medical examination with abnormal findings: Secondary | ICD-10-CM

## 2019-04-20 DIAGNOSIS — Z125 Encounter for screening for malignant neoplasm of prostate: Secondary | ICD-10-CM

## 2019-04-20 DIAGNOSIS — E119 Type 2 diabetes mellitus without complications: Secondary | ICD-10-CM | POA: Diagnosis not present

## 2019-04-20 LAB — LIPID PANEL
Cholesterol: 121 mg/dL (ref 0–200)
HDL: 26.1 mg/dL — ABNORMAL LOW (ref >39.00)
LDL Cholesterol: 71 mg/dL (ref 0–99)
NonHDL: 94.76
Total CHOL/HDL Ratio: 5
Triglycerides: 117 mg/dL (ref 0.0–149.0)
VLDL: 23.4 mg/dL (ref 0.0–40.0)

## 2019-04-20 LAB — MICROALBUMIN / CREATININE URINE RATIO
Creatinine,U: 139 mg/dL
Microalb Creat Ratio: 22.1 mg/g (ref 0.0–30.0)
Microalb, Ur: 30.8 mg/dL — ABNORMAL HIGH (ref 0.0–1.9)

## 2019-04-20 LAB — BASIC METABOLIC PANEL
BUN: 22 mg/dL (ref 6–23)
CO2: 27 mEq/L (ref 19–32)
Calcium: 9.1 mg/dL (ref 8.4–10.5)
Chloride: 101 mEq/L (ref 96–112)
Creatinine, Ser: 1.09 mg/dL (ref 0.40–1.50)
GFR: 84.32 mL/min (ref >60.00)
Glucose, Bld: 194 mg/dL — ABNORMAL HIGH (ref 70–99)
Potassium: 3.9 mEq/L (ref 3.5–5.1)
Sodium: 138 mEq/L (ref 135–145)

## 2019-04-20 LAB — HEMOGLOBIN A1C: Hgb A1c MFr Bld: 6.9 % — ABNORMAL HIGH (ref 4.6–6.5)

## 2019-04-20 LAB — PSA: PSA: 1.49 ng/mL (ref 0.10–4.00)

## 2019-04-20 NOTE — Patient Instructions (Signed)
A few things to remember from today's visit:   Routine general medical examination at a health care facility  Essential hypertension - Plan: Basic metabolic panel  Controlled type 2 diabetes mellitus without complication, without long-term current use of insulin ([place]) - Plan: Hemoglobin F8H, Basic metabolic panel, Microalbumin / creatinine urine ratio  Pure hypercholesterolemia - Plan: Lipid panel  Prostate cancer screening - Plan: PSA(Must document that pt has been informed of limitations of PSA testing.)   At least 150 minutes of moderate exercise per week, daily brisk walking for 15-30 min is a good exercise option. Healthy diet low in saturated (animal) fats and sweets and consisting of fresh fruits and vegetables, lean meats such as fish and white chicken and whole grains.  - Vaccines:  Tdap vaccine every 10 years.  Shingles vaccine recommended at age 57, could be given after 56 years of age but not sure about insurance coverage.  Pneumonia vaccines:  Prevnar 27 at 27 and Pneumovax at 16.   -Screening recommendations for low/normal risk males:   Colon cancer screening at age 13 and until age 23.  Prostate cancer screening: some controversy, starts usually at 42: Rectal exam and PSA.  Aortic Abdominal Aneurism once between 31 and 17 years old if ever smoker.  Also recommended:  1. Dental visit- Brush and floss your teeth twice daily; visit your dentist twice a year. 2. Eye doctor- Get an eye exam at least every 2 years. 3. Helmet use- Always wear a helmet when riding a bicycle, motorcycle, rollerblading or skateboarding. 4. Safe sex- If you may be exposed to sexually transmitted infections, use a condom. 5. Seat belts- Seat belts can save your live; always wear one. 6. Smoke/Carbon Monoxide detectors- These detectors need to be installed on the appropriate level of your home. Replace batteries at least once a year. 7. Skin cancer- When out in the sun please cover up and  use sunscreen 15 SPF or higher. 8. Violence- If anyone is threatening or hurting you, please tell your healthcare provider.  9. Drink alcohol in moderation- Limit alcohol intake to one drink or less per day. Never drink and drive.  Please be sure medication list is accurate. If a new problem present, please set up appointment sooner than planned today.

## 2019-04-21 MED ORDER — METFORMIN HCL ER 500 MG PO TB24: 1000.0000 mg | ORAL_TABLET | Freq: Every day | ORAL | 1 refills | Status: DC

## 2019-04-21 MED ORDER — LOVASTATIN 10 MG PO TABS: 10.0000 mg | ORAL_TABLET | Freq: Every day | ORAL | 3 refills | Status: DC

## 2019-05-07 ENCOUNTER — Telehealth: Payer: Self-pay | Admitting: Family Medicine

## 2019-05-07 NOTE — Telephone Encounter (Signed)
Pt called and is requesting a machine to check his blood sugar so he is not having to prick himself. Please advise.

## 2019-05-10 ENCOUNTER — Other Ambulatory Visit: Payer: Self-pay | Admitting: Family Medicine

## 2019-05-10 DIAGNOSIS — R0981 Nasal congestion: Secondary | ICD-10-CM

## 2019-05-10 DIAGNOSIS — J309 Allergic rhinitis, unspecified: Secondary | ICD-10-CM

## 2019-05-13 NOTE — Telephone Encounter (Signed)
Message sent to Dr. Jordan for review and approval. 

## 2019-05-29 ENCOUNTER — Other Ambulatory Visit: Payer: Self-pay | Admitting: Family Medicine

## 2019-05-29 MED ORDER — FREESTYLE LIBRE READER DEVI: 1.0000 | 2 refills | Status: AC

## 2019-05-29 MED ORDER — FREESTYLE LIBRE SENSOR SYSTEM MISC: 1.0000 | Freq: Three times a day (TID) | 2 refills | Status: DC | PRN

## 2019-05-29 NOTE — Telephone Encounter (Signed)
Rx sent. Please explain that his insurance may not cover this type of devices. Thanks, BJ

## 2019-06-01 NOTE — Telephone Encounter (Signed)
Noted. Patient informed.

## 2019-08-19 ENCOUNTER — Telehealth (INDEPENDENT_AMBULATORY_CARE_PROVIDER_SITE_OTHER): Payer: Non-veteran care | Admitting: Family Medicine

## 2019-08-19 ENCOUNTER — Encounter: Payer: Self-pay | Admitting: Family Medicine

## 2019-08-19 VITALS — BP 126/80 | Ht 67.0 in

## 2019-08-19 DIAGNOSIS — R519 Headache, unspecified: Secondary | ICD-10-CM

## 2019-08-19 DIAGNOSIS — J0110 Acute frontal sinusitis, unspecified: Secondary | ICD-10-CM | POA: Diagnosis not present

## 2019-08-19 MED ORDER — AMOXICILLIN-POT CLAVULANATE 875-125 MG PO TABS: 1.0000 | ORAL_TABLET | Freq: Two times a day (BID) | ORAL | 0 refills | Status: AC

## 2019-08-19 MED ORDER — PREDNISONE 20 MG PO TABS: 40.0000 mg | ORAL_TABLET | Freq: Every day | ORAL | 0 refills | Status: AC

## 2019-08-19 NOTE — Progress Notes (Signed)
Virtual Visit via Video Note   I connected with Brent Cook on 08/19/19 by a video enabled telemedicine application and verified that I am speaking with the correct person using two identifiers.  Location patient: home Location provider:work office Persons participating in the virtual visit: patient, provider  I discussed the limitations of evaluation and management by telemedicine and the availability of in person appointments. The patient expressed understanding and agreed to proceed.   HPI: Brent Cook is a 58 yo male with Hx of hypertension, asthma, DM 2, and hyperlipidemia complaining of "sinus headache." He has had intermittent episodes of frontal throbbing headache. Headache started about 2 weeks ago, it seems to be getting worse.  4/10 in intensity. He has not identified exacerbating or alleviating factors. No hx of trauma.  He has taken Tylenol. Allergies rhinitis,he is not on Flonase nasal spray.  He denies visual changes, photophobia, nasal congestion, rhinorrhea, sore throat, postnasal drainage, cough, dyspnea, wheezing, abdominal pain, nausea, vomiting, or focal neurologic deficit. + Phonophobia. He used Flonase nasal spray  BP 126/80. BS's "good."  ROS: See pertinent positives and negatives per HPI.  Past Medical History:  Diagnosis Date  . Arthritis    left knee  . Asthma    as child  . Diabetes mellitus    type 2  . Erectile dysfunction   . Hypertension     Past Surgical History:  Procedure Laterality Date  . meniscus tear     pt. reported 10-15 years ago  . TONSILLECTOMY     childhood  . 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 WISDOM TOOTH EXTRACTION     with sedation    Family History  Problem Relation Age of Onset  . Cancer Father   . Colon cancer Father        dx'd late 37's early 56's  . Cancer  Mother   . Cancer Maternal Grandfather   . Colon polyps Neg Hx   . Rectal cancer Neg Hx   . Stomach cancer Neg Hx     Social History   Socioeconomic History  . Marital status: Married    Spouse name: Not on file  . Number of children: Not on file  . Years of education: Not on file  . Highest education level: Not on file  Occupational History    Employer: [employer]  Social Needs  . Financial resource strain: Not on file  . Food insecurity    Worry: Not on file    Inability: Not on file  . Transportation needs    Medical: Not on file    Non-medical: Not on file  Tobacco Use  . Smoking status: Never Smoker  . Smokeless tobacco: Never Used  Substance and Sexual Activity  . Alcohol use: No    Alcohol/week: 0.0 standard drinks  . Drug use: No  . Sexual activity: Not on file  Lifestyle  . Physical activity    Days per week: Not on file    Minutes per session: Not on file  . Stress: Not on file  Relationships  . Social Herbalist on phone: Not on file    Gets together: Not on file    Attends religious service: Not on file    Active member of club or organization: Not on file    Attends meetings of clubs or  organizations: Not on file    Relationship status: Not on file  . Intimate partner violence    Fear of current or ex partner: Not on file    Emotionally abused: Not on file    Physically abused: Not on file    Forced sexual activity: Not on file  Other Topics Concern  . Not on file  Social History Narrative  . Not on file    Current Outpatient Medications:  .  amoxicillin-clavulanate (AUGMENTIN) 875-125 MG tablet, Take 1 tablet by mouth 2 (two) times daily for 10 days., Disp: 20 tablet, Rfl: 0 .  Continuous Blood Gluc Receiver (FREESTYLE LIBRE READER) DEVI, 1 Device by Does not apply route as directed., Disp: 6 Device, Rfl: 2 .  Continuous Blood Gluc Sensor (FREESTYLE LIBRE SENSOR SYSTEM) MISC, 1 Device by Does not apply route 3 (three) times daily as  needed., Disp: 6 each, Rfl: 2 .  fluticasone (FLONASE) 50 MCG/ACT nasal spray, SPRAY 2 SPRAYS INTO EACH NOSTRIL EVERY DAY, Disp: 16 mL, Rfl: 2 .  lovastatin (MEVACOR) 10 MG tablet, Take 1 tablet (10 mg total) by mouth at bedtime., Disp: 90 tablet, Rfl: 3 .  metFORMIN (GLUCOPHAGE-XR) 500 MG 24 hr tablet, Take 2 tablets (1,000 mg total) by mouth daily with supper., Disp: 180 tablet, Rfl: 1 .  olmesartan-hydrochlorothiazide (BENICAR HCT) 20-12.5 MG tablet, TAKE 1 TABLET BY MOUTH EVERY DAY, Disp: 90 tablet, Rfl: 1 .  predniSONE (DELTASONE) 20 MG tablet, Take 2 tablets (40 mg total) by mouth daily with breakfast for 3 days., Disp: 6 tablet, Rfl: 0 .  tadalafil (ADCIRCA/CIALIS) 20 MG tablet, Take 1 tablet (20 mg total) by mouth daily as needed for erectile dysfunction., Disp: 10 tablet, Rfl: 0 .  vardenafil (LEVITRA) 20 MG tablet, Take 1 tablet (20 mg total) by mouth daily as needed for erectile dysfunction., Disp: 10 tablet, Rfl: 0  EXAM:  VITALS per patient if applicable:BP [id number]   Ht 5\' 7"  (1.702 m)   BMI 31.79 kg/m   GENERAL: alert, oriented, appears well and in no acute distress  HEENT: atraumatic, conjunctiva clear, no obvious abnormalities on inspection of external nose and ears Pain elicited when he applies pressure on frontal sinuses. I do not appreciate edema or erythema.   NECK: normal movements of the head and neck  LUNGS: on inspection no signs of respiratory distress, breathing rate appears normal, no obvious gross SOB, gasping or wheezing  CV: no obvious cyanosis  MS: moves all visible extremities without noticeable abnormality  PSYCH/NEURO: pleasant and cooperative, no obvious depression or anxiety, speech and thought processing grossly intact  ASSESSMENT AND PLAN:  Discussed the following assessment and plan:  Frontal headache We discussed possible etiologies. I do not think imaging is needed at this time. Continue Tylenol 500 mg 4 times daily as needed for  pain.  Problem could be secondary related with allergies. Instructed about warning signs.  Acute frontal sinusitis, recurrence not specified - Plan: amoxicillin-clavulanate (AUGMENTIN) 875-125 MG tablet Because problem has been going on for about 2 weeks and getting worse, will treat as sinusitis. I recommend taking prednisone 40 mg for 3 days, with breakfast, monitor BS closely. If frontal headache is not resolved, recommend starting antibiotic treatment. We discussed some side effects of antibiotics, instructed to complete treatment. Flonase nasal spray after completed prednisone may also help.  If problem is not abated we need to consider head/sinus imaging.    I discussed the assessment and treatment plan with the patient. He  was provided an opportunity to ask questions and all were answered. He agreed with the plan and demonstrated an understanding of the instructions.   The patient was advised to call back or seek an in-person evaluation if the symptoms worsen or if the condition fails to improve as anticipated.  Return if symptoms worsen or fail to improve.    [place], MD

## 2019-10-19 ENCOUNTER — Other Ambulatory Visit: Payer: Self-pay

## 2019-10-19 ENCOUNTER — Ambulatory Visit (INDEPENDENT_AMBULATORY_CARE_PROVIDER_SITE_OTHER): Payer: 59 | Admitting: Family Medicine

## 2019-10-19 ENCOUNTER — Encounter: Payer: Self-pay | Admitting: Family Medicine

## 2019-10-19 VITALS — BP 126/80 | HR 93 | Temp 95.7°F | Resp 12 | Ht 67.0 in | Wt 205.0 lb

## 2019-10-19 DIAGNOSIS — I10 Essential (primary) hypertension: Secondary | ICD-10-CM | POA: Diagnosis not present

## 2019-10-19 DIAGNOSIS — E6609 Other obesity due to excess calories: Secondary | ICD-10-CM

## 2019-10-19 DIAGNOSIS — Z6831 Body mass index (BMI) 31.0-31.9, adult: Secondary | ICD-10-CM

## 2019-10-19 DIAGNOSIS — E119 Type 2 diabetes mellitus without complications: Secondary | ICD-10-CM | POA: Diagnosis not present

## 2019-10-19 DIAGNOSIS — E1129 Type 2 diabetes mellitus with other diabetic kidney complication: Secondary | ICD-10-CM | POA: Diagnosis not present

## 2019-10-19 DIAGNOSIS — R809 Proteinuria, unspecified: Secondary | ICD-10-CM

## 2019-10-19 DIAGNOSIS — E669 Obesity, unspecified: Secondary | ICD-10-CM | POA: Insufficient documentation

## 2019-10-19 LAB — BASIC METABOLIC PANEL
BUN: 21 mg/dL (ref 6–23)
CO2: 27 mEq/L (ref 19–32)
Calcium: 9.2 mg/dL (ref 8.4–10.5)
Chloride: 104 mEq/L (ref 96–112)
Creatinine, Ser: 1.08 mg/dL (ref 0.40–1.50)
GFR: 85.07 mL/min (ref >60.00)
Glucose, Bld: 233 mg/dL — ABNORMAL HIGH (ref 70–99)
Potassium: 4 mEq/L (ref 3.5–5.1)
Sodium: 138 mEq/L (ref 135–145)

## 2019-10-19 LAB — MICROALBUMIN / CREATININE URINE RATIO
Creatinine,U: 130.7 mg/dL
Microalb Creat Ratio: 53.3 mg/g — ABNORMAL HIGH (ref 0.0–30.0)
Microalb, Ur: 69.6 mg/dL — ABNORMAL HIGH (ref 0.0–1.9)

## 2019-10-19 LAB — HEMOGLOBIN A1C: Hgb A1c MFr Bld: 6.9 % — ABNORMAL HIGH (ref 4.6–6.5)

## 2019-10-19 MED ORDER — OLMESARTAN MEDOXOMIL-HCTZ 20-12.5 MG PO TABS: 1.0000 | ORAL_TABLET | Freq: Every day | ORAL | 1 refills | Status: DC

## 2019-10-19 MED ORDER — METFORMIN HCL ER 500 MG PO TB24: 1000.0000 mg | ORAL_TABLET | Freq: Every day | ORAL | 1 refills | Status: DC

## 2019-10-19 NOTE — Patient Instructions (Addendum)
A few things to remember from today's visit:   Controlled type 2 diabetes mellitus without complication, without long-term current use of insulin ([place]) - Plan: metFORMIN (GLUCOPHAGE-XR) 500 MG 24 hr tablet, Basic Metabolic Panel, Microalbumin / creatinine urine ratio, Hemoglobin A1c  Essential hypertension - Plan: olmesartan-hydrochlorothiazide (BENICAR HCT) 20-12.5 MG tablet, Basic Metabolic Panel  No changes today.  Please be sure medication list is accurate. If a new problem present, please set up appointment sooner than planned today.

## 2019-10-19 NOTE — Progress Notes (Signed)
HPI:   [name] is a 59 y.o. male, who is here today for 6 months follow up.   He was last seen on 04/20/19.   HTN: BP "Ok" 120's/70-80's. Denies severe/frequent headache, visual changes, chest pain, dyspnea, palpitation, claudication, focal weakness, or edema.  Lab Results  Component Value Date   CREATININE 1.09 04/20/2019   BUN 22 04/20/2019   NA 138 04/20/2019   K 3.9 04/20/2019   CL 101 04/20/2019   CO2 27 04/20/2019   DM II: BS's 140's. He has had t times BS's 80's.  Denies polydipsia,polyuria, or polyphagia.  Lab Results  Component Value Date   HGBA1C 6.9 (H) 04/20/2019  + Microalbuminuria. He has not noted foam in urine,gross hematuria ,or decreased urine output.  He has not been consistent with regular exercise. In general he thinks he is following a healthful duet.  Review of Systems  Constitutional: Negative for activity change, appetite change, fatigue and fever.  HENT: Negative for mouth sores, nosebleeds and sore throat.   Eyes: Negative for redness and visual disturbance.  Respiratory: Negative for cough and wheezing.   Gastrointestinal: Negative for abdominal pain, nausea and vomiting.  Genitourinary: Negative for decreased urine volume, dysuria and hematuria.  Neurological: Negative for dizziness, facial asymmetry, speech difficulty and weakness.  Rest of ROS, see pertinent positives sand negatives in HPI  Current Outpatient Medications on File Prior to Visit  Medication Sig Dispense Refill  . Continuous Blood Gluc Receiver (FREESTYLE LIBRE READER) DEVI 1 Device by Does not apply route as directed. 6 Device 2  . Continuous Blood Gluc Sensor (FREESTYLE LIBRE SENSOR SYSTEM) MISC 1 Device by Does not apply route 3 (three) times daily as needed. 6 each 2  . fluticasone (FLONASE) 50 MCG/ACT nasal spray SPRAY 2 SPRAYS INTO EACH NOSTRIL EVERY DAY 16 mL 2  . lovastatin (MEVACOR) 10 MG tablet Take 1 tablet (10 mg total) by mouth at bedtime.  90 tablet 3  . tadalafil (ADCIRCA/CIALIS) 20 MG tablet Take 1 tablet (20 mg total) by mouth daily as needed for erectile dysfunction. 10 tablet 0  . vardenafil (LEVITRA) 20 MG tablet Take 1 tablet (20 mg total) by mouth daily as needed for erectile dysfunction. 10 tablet 0   No current facility-administered medications on file prior to visit.   Past Medical History:  Diagnosis Date  . Arthritis    left knee  . Asthma    as child  . Diabetes mellitus    type 2  . Erectile dysfunction   . Hypertension    Allergies  Allergen Reactions  . Other     Social History   Socioeconomic History  . Marital status: Married    Spouse name: Not on file  . Number of children: Not on file  . Years of education: Not on file  . Highest education level: Not on file  Occupational History    Employer: [employer]  Tobacco Use  . Smoking status: Never Smoker  . Smokeless tobacco: Never Used  Substance and Sexual Activity  . Alcohol use: No    Alcohol/week: 0.0 standard drinks  . Drug use: No  . Sexual activity: Not on file  Other Topics Concern  . Not on file  Social History Narrative  . Not on file   Social Determinants of Health   Financial Resource Strain:   . Difficulty of Paying Living Expenses: Not on file  Food Insecurity:   . Worried About [employer] of  Food in the Last Year: Not on file  . Ran Out of Food in the Last Year: Not on file  Transportation Needs:   . Lack of Transportation (Medical): Not on file  . Lack of Transportation (Non-Medical): Not on file  Physical Activity:   . Days of Exercise per Week: Not on file  . Minutes of Exercise per Session: Not on file  Stress:   . Feeling of Stress : Not on file  Social Connections:   . Frequency of Communication with Friends and Family: Not on file  . Frequency of Social Gatherings with Friends and Family: Not on file  . Attends Religious Services: Not on file  . Active Member of Clubs or Organizations: Not on file  .  Attends Archivist Meetings: Not on file  . Marital Status: Not on file    Vitals:   10/19/19 0804  BP: 126/80  Pulse: 93  Resp: 12  Temp: (!) 95.7 F (35.4 C)  SpO2: 94%   Wt Readings from Last 3 Encounters:  10/19/19 205 lb (93 kg)  04/20/19 203 lb (92.1 kg)  12/26/18 209 lb 2 oz (94.9 kg)    Body mass index is 32.11 kg/m.  Physical Exam  Nursing note and vitals reviewed. Constitutional: He is oriented to person, place, and time. He appears well-developed. No distress.  HENT:  Head: Normocephalic and atraumatic.  Mouth/Throat: Oropharynx is clear and moist and mucous membranes are normal.  Eyes: Pupils are equal, round, and reactive to light. Conjunctivae are normal.  Cardiovascular: Normal rate and regular rhythm.  No murmur heard. Pulses:      Dorsalis pedis pulses are 2+ on the right side and 2+ on the left side.  Respiratory: Effort normal and breath sounds normal. No respiratory distress.  GI: Soft. He exhibits no mass. There is no hepatomegaly. There is no abdominal tenderness.  Musculoskeletal:        General: No edema.  Lymphadenopathy:    He has no cervical adenopathy.  Neurological: He is alert and oriented to person, place, and time. He has normal strength. No cranial nerve deficit. Gait normal.  Skin: Skin is warm. No rash noted. No erythema.  Psychiatric: He has a normal mood and affect.  Well groomed, good eye contact.   ASSESSMENT AND PLAN:   Brent Cook Cook was seen today for 6 months follow-up.  Orders Placed This Encounter  Procedures  . Basic Metabolic Panel  . Microalbumin / creatinine urine ratio  . Hemoglobin A1c   Lab Results  Component Value Date   HGBA1C 6.9 (H) 10/19/2019   Lab Results  Component Value Date   MICROALBUR 69.6 (H) 10/19/2019   Lab Results  Component Value Date   CREATININE 1.08 10/19/2019   BUN 21 10/19/2019   NA 138 10/19/2019   K 4.0 10/19/2019   CL 104 10/19/2019   CO2 27 10/19/2019     Diabetes mellitus type II, controlled, with no complications ([place]) [id number] has been at goal. No changes in current management, will adjust treatment according to A1C result. Regular exercise and healthy diet with avoidance of added sugar food intake is an important part of treatment and recommended. Annual eye exam, periodic dental and foot care recommended. F/U in 5-6 months   Essential hypertension BP adequately controlled. No changes in current management. Continue low salt diet. Continue monitoring BP,ideally at home. Eye exam reported as current.  Class 1 obesity with serious comorbidity and body mass index (BMI)  of 31.0 to 31.9 in adult We discussed benefits of wt loss as well as adverse effects of obesity. Consistency with healthy diet and physical activity recommended.  Microalbuminuria due to type 2 diabetes mellitus (HCC) Adequate glucose and BP controlled. Avoid NSAID;s and continue low salt diet. Continue Benicar.  Return in about 6 months (around 04/20/2020) for CPE.   Quynh Basso G. Martinique, MD  [facility]. [place] office.

## 2019-10-19 NOTE — Assessment & Plan Note (Signed)
BP adequately controlled. No changes in current management. Continue low salt diet. Continue monitoring BP,ideally at home. Eye exam reported as current.

## 2019-10-19 NOTE — Assessment & Plan Note (Signed)
We discussed benefits of wt loss as well as adverse effects of obesity. Consistency with healthy diet and physical activity recommended.  

## 2019-10-19 NOTE — Assessment & Plan Note (Signed)
HgA1C has been at goal. No changes in current management, will adjust treatment according to A1C result. Regular exercise and healthy diet with avoidance of added sugar food intake is an important part of treatment and recommended. Annual eye exam, periodic dental and foot care recommended. F/U in 5-6 months

## 2019-10-21 ENCOUNTER — Other Ambulatory Visit: Payer: Self-pay

## 2019-10-21 MED ORDER — JARDIANCE 10 MG PO TABS: 10.0000 mg | ORAL_TABLET | Freq: Every day | ORAL | 3 refills | Status: DC

## 2020-01-01 ENCOUNTER — Other Ambulatory Visit: Payer: Self-pay | Admitting: Family Medicine

## 2020-02-22 ENCOUNTER — Other Ambulatory Visit: Payer: Self-pay | Admitting: Family Medicine

## 2020-03-25 ENCOUNTER — Other Ambulatory Visit: Payer: Self-pay | Admitting: Emergency Medicine

## 2020-03-25 ENCOUNTER — Ambulatory Visit
Admission: RE | Admit: 2020-03-25 | Discharge: 2020-03-25 | Disposition: A | Discharge status: Home / Self Care | Payer: No Typology Code available for payment source | Source: Ambulatory Visit | Attending: Emergency Medicine | Admitting: Emergency Medicine

## 2020-03-25 DIAGNOSIS — R52 Pain, unspecified: Secondary | ICD-10-CM

## 2020-04-23 ENCOUNTER — Other Ambulatory Visit: Payer: Self-pay | Admitting: Family Medicine

## 2020-04-23 DIAGNOSIS — E119 Type 2 diabetes mellitus without complications: Secondary | ICD-10-CM

## 2020-04-25 ENCOUNTER — Other Ambulatory Visit: Payer: Self-pay | Admitting: Family Medicine

## 2020-04-25 ENCOUNTER — Telehealth: Payer: Self-pay | Admitting: Family Medicine

## 2020-04-25 DIAGNOSIS — E119 Type 2 diabetes mellitus without complications: Secondary | ICD-10-CM

## 2020-04-25 DIAGNOSIS — E7800 Pure hypercholesterolemia, unspecified: Secondary | ICD-10-CM

## 2020-04-25 DIAGNOSIS — I10 Essential (primary) hypertension: Secondary | ICD-10-CM

## 2020-04-25 NOTE — Telephone Encounter (Signed)
Pt is calling in stating that he is in [place] and forgot his medication and would like to see if he could get a weeks worth of  Rx metformin 500 MG and olmesartan-hydrochlorothiazide 20-12.5 MG   Pharm:  [facility]. [address] [address] [phone](p)

## 2020-04-26 MED ORDER — METFORMIN HCL ER 500 MG PO TB24: 1000.0000 mg | ORAL_TABLET | Freq: Every day | ORAL | 0 refills | Status: DC

## 2020-04-26 MED ORDER — OLMESARTAN MEDOXOMIL-HCTZ 20-12.5 MG PO TABS: 1.0000 | ORAL_TABLET | Freq: Every day | ORAL | 0 refills | Status: DC

## 2020-04-26 NOTE — Telephone Encounter (Signed)
Rx's sent in as requested. 

## 2020-05-13 ENCOUNTER — Other Ambulatory Visit: Payer: Self-pay | Admitting: Family Medicine

## 2020-05-13 DIAGNOSIS — I10 Essential (primary) hypertension: Secondary | ICD-10-CM

## 2020-06-09 ENCOUNTER — Telehealth (INDEPENDENT_AMBULATORY_CARE_PROVIDER_SITE_OTHER): Payer: 59 | Admitting: Adult Health

## 2020-06-09 ENCOUNTER — Encounter: Payer: Self-pay | Admitting: Adult Health

## 2020-06-09 DIAGNOSIS — E739 Lactose intolerance, unspecified: Secondary | ICD-10-CM | POA: Diagnosis not present

## 2020-06-09 NOTE — Progress Notes (Signed)
Virtual Visit via Video Note  I connected with Brent Cook  on 06/09/20 at 11:00 AM EDT by a video enabled telemedicine application and verified that I am speaking with the correct person using two identifiers.  Location patient: home Location provider:work or home office Persons participating in the virtual visit: patient, provider  I discussed the limitations of evaluation and management by telemedicine and the availability of in person appointments. The patient expressed understanding and agreed to proceed.   HPI: 59 year old male, patient of Dr. Martinique who I am seeing today for a new issue.  Per patient reports he has been having intermittent episodes of abdominal pain and diarrhea over the last week.  Symptoms first started after he had an ice cream sandwich which caused abdominal cramping, diarrhea and vomiting this resolved within a couple of days.  He then had a milkshake from [employer] and again experienced abdominal cramping and diarrhea for couple of days.  Last night he had a steak and cheese sandwich and woke up in the middle the night with some mild abdominal pain and nausea.  This resolved within about 30 minutes to an hour.  Has never had any issues with dairy in the past.   ROS: See pertinent positives and negatives per HPI.  Past Medical History:  Diagnosis Date  . Arthritis    left knee  . Asthma    as child  . Diabetes mellitus    type 2  . Erectile dysfunction   . Hypertension     Past Surgical History:  Procedure Laterality Date  . meniscus tear     pt. reported 10-15 years ago  . TONSILLECTOMY     childhood  . 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 WISDOM TOOTH EXTRACTION     with sedation    Family History  Problem Relation Age of Onset  . Cancer Father   . Colon cancer Father        dx'd late 64's  early 13's  . Cancer Mother   . Cancer Maternal Grandfather   . Colon polyps Neg Hx   . Rectal cancer Neg Hx   . Stomach cancer Neg Hx        Current Outpatient Medications:  .  Continuous Blood Gluc Receiver (FREESTYLE LIBRE READER) DEVI, 1 Device by Does not apply route as directed., Disp: 6 Device, Rfl: 2 .  Continuous Blood Gluc Sensor (FREESTYLE LIBRE SENSOR SYSTEM) MISC, 1 Device by Does not apply route 3 (three) times daily as needed., Disp: 6 each, Rfl: 2 .  fluticasone (FLONASE) 50 MCG/ACT nasal spray, SPRAY 2 SPRAYS INTO EACH NOSTRIL EVERY DAY, Disp: 16 mL, Rfl: 2 .  JARDIANCE 10 MG TABS tablet, TAKE 1 TABLET BY MOUTH EVERY DAY BEFORE BREAKFAST, Disp: 30 tablet, Rfl: 3 .  olmesartan-hydrochlorothiazide (BENICAR HCT) 20-12.5 MG tablet, TAKE 1 TABLET BY MOUTH EVERY DAY, Disp: 90 tablet, Rfl: 1 .  tadalafil (CIALIS) 20 MG tablet, TAKE 1 TABLET BY MOUTH EVERY DAY AS NEEDED FOR ERECTILE DYSFUNCTION, Disp: 6 tablet, Rfl: 2 .  vardenafil (LEVITRA) 20 MG tablet, Take 1 tablet (20 mg total) by mouth daily as needed for erectile dysfunction., Disp: 10 tablet, Rfl: 0 .  lovastatin (MEVACOR) 10 MG tablet, TAKE 1 TABLET BY MOUTH EVERYDAY AT BEDTIME (Patient not taking: Reported on 06/09/2020), Disp: 90 tablet, Rfl: 3 .  metFORMIN (GLUCOPHAGE-XR) 500 MG 24 hr tablet, TAKE 2 TABLETS (1,000 MG TOTAL) BY MOUTH DAILY WITH SUPPER. (Patient not taking: Reported on 06/09/2020), Disp: 180 tablet, Rfl: 1 .  metFORMIN (GLUCOPHAGE-XR) 500 MG 24 hr tablet, Take 2 tablets (1,000 mg total) by mouth daily with supper. (Patient not taking: Reported on 06/09/2020), Disp: 14 tablet, Rfl: 0  EXAM:  VITALS per patient if applicable:  GENERAL: alert, oriented, appears well and in no acute distress  HEENT: atraumatic, conjunttiva clear, no obvious abnormalities on inspection of external nose and ears  NECK: normal movements of the head and neck  LUNGS: on inspection no signs of respiratory distress, breathing rate  appears normal, no obvious gross SOB, gasping or wheezing  CV: no obvious cyanosis  MS: moves all visible extremities without noticeable abnormality  PSYCH/NEURO: pleasant and cooperative, no obvious depression or anxiety, speech and thought processing grossly intact  ASSESSMENT AND PLAN:  Discussed the following assessment and plan:  1. Lactose intolerance -Symptoms appear to be related to lactose intolerance which is new for him.  He would not like to undergo formal testing stating "I can just stay away from dairy because the pain is not worth it".  He was advised that he can use Lactaid and stick to foods made from alternative products such as soy or almond milk.     I discussed the assessment and treatment plan with the patient. The patient was provided an opportunity to ask questions and all were answered. The patient agreed with the plan and demonstrated an understanding of the instructions.   The patient was advised to call back or seek an in-person evaluation if the symptoms worsen or if the condition fails to improve as anticipated.   Dorothyann Peng, NP

## 2020-08-11 ENCOUNTER — Other Ambulatory Visit: Payer: Self-pay | Admitting: Family Medicine

## 2020-10-22 ENCOUNTER — Other Ambulatory Visit: Payer: Self-pay | Admitting: Family Medicine

## 2020-11-04 ENCOUNTER — Other Ambulatory Visit: Payer: Self-pay | Admitting: Family Medicine

## 2020-11-04 DIAGNOSIS — I10 Essential (primary) hypertension: Secondary | ICD-10-CM

## 2021-03-16 ENCOUNTER — Other Ambulatory Visit: Payer: Self-pay | Admitting: Family Medicine

## 2021-05-09 ENCOUNTER — Encounter: Payer: Self-pay | Admitting: Internal Medicine

## 2021-05-11 ENCOUNTER — Other Ambulatory Visit: Payer: Self-pay | Admitting: Family Medicine

## 2021-05-15 ENCOUNTER — Other Ambulatory Visit: Payer: Self-pay | Admitting: Family Medicine

## 2021-05-15 DIAGNOSIS — I10 Essential (primary) hypertension: Secondary | ICD-10-CM

## 2021-06-13 ENCOUNTER — Other Ambulatory Visit: Payer: Self-pay | Admitting: Family Medicine

## 2021-06-13 DIAGNOSIS — E119 Type 2 diabetes mellitus without complications: Secondary | ICD-10-CM

## 2021-06-14 MED ORDER — METFORMIN HCL ER 500 MG PO TB24: 1000.0000 mg | ORAL_TABLET | Freq: Every day | ORAL | 0 refills | Status: DC

## 2021-06-14 NOTE — Addendum Note (Signed)
Addended by: Rodrigo Ran on: 06/14/2021 07:16 AM   Modules accepted: Orders

## 2021-07-12 ENCOUNTER — Other Ambulatory Visit: Payer: Self-pay | Admitting: Family Medicine

## 2021-07-12 DIAGNOSIS — E119 Type 2 diabetes mellitus without complications: Secondary | ICD-10-CM

## 2021-08-11 ENCOUNTER — Other Ambulatory Visit: Payer: Self-pay | Admitting: Family Medicine

## 2021-09-05 NOTE — Progress Notes (Signed)
HPI: Mr. Brent Cook is a 60 y.o.male here today for his routine physical examination and follow up.  Last CPE: 04/20/2019 He lives with his wife.  Regular exercise 3 or more times per week: He is walking 30-40 min daily and wt lifting. Following a healthy diet: "Better", 1-2 diet sodas per week, increased water intake, no red meat, chicken and fish.  Chronic medical problems: ED,HTN,DM II,and HLD among some.  Immunization History  Administered Date(s) Administered   Influenza,inj,Quad PF,6+ Mos 10/12/2014   Pneumococcal Conjugate-13 06/04/2014   Pneumococcal Polysaccharide-23 10/12/2014   Td 10/08/2001   Tdap 11/07/2011    Health Maintenance  Topic Date Due   OPHTHALMOLOGY EXAM  10/26/2016   COVID-19 Vaccine (4 - Booster for Moderna series) 10/17/2020   COLONOSCOPY (Pts 45-28yrs Insurance coverage will need to be confirmed)  12/01/2020   INFLUENZA VACCINE  01/05/2022 (Originally 05/08/2021)   HIV Screening  09/06/2032 (Originally 02/19/1976)   HEMOGLOBIN A1C  03/06/2022   FOOT EXAM  09/06/2022   TETANUS/TDAP  10/08/2022   Pneumococcal Vaccine 97-46 Years old (3 - PPSV23 if available, else PCV20) 02/20/2027   Hepatitis C Screening  Completed   Zoster Vaccines- Shingrix  Completed   HPV VACCINES  Aged Out   Last prostate ca screening: 2020 1.49 Nocturia x 0.  -Negative for high alcohol intake or tobacco use.  -Concerns and/or follow up today:   Diabetes Mellitus II:  - Checking BG at home: 120's-180's - Medications: Metformin XR 500 mg 2 tablets daily, Jardiance 10 mg daily. - eye exam: At the [place] 2 months ago and recommended 6 months follow up. - foot exam: due - microalbumin: 10/19/2019 - Negative for symptoms of hypoglycemia, polyuria, polydipsia, numbness extremities, foot ulcers/trauma  He goes to the [place] for DM II, last HgA1C 6.2 about 6 months ago.  Lab Results  Component Value Date   HGBA1C 6.9 (H) 10/19/2019   Lab Results  Component Value Date    MICROALBUR 69.6 (H) 10/19/2019   Hyperlipidemia: Currently on Lovastatin 10 mg daily. Following a low fat diet: Yes.. Side effects from medication:None. Lab Results  Component Value Date   CHOL 121 04/20/2019   HDL 26.10 (L) 04/20/2019   LDLCALC 71 04/20/2019   TRIG 117.0 04/20/2019   CHOLHDL 5 04/20/2019   Hypertension:  Medications:Olmesartan-hctz 20-12.5 mg daily. BP readings at home:130's/80's at work. Side effects:None. Lab Results  Component Value Date   CREATININE 1.08 10/19/2019   BUN 21 10/19/2019   NA 138 10/19/2019   K 4.0 10/19/2019   CL 104 10/19/2019   CO2 27 10/19/2019   Review of Systems  Constitutional:  Negative for activity change, appetite change, fatigue and fever.  HENT:  Negative for mouth sores, nosebleeds, sore throat, trouble swallowing and voice change.   Eyes:  Negative for redness and visual disturbance.  Respiratory:  Negative for cough, shortness of breath and wheezing.   Cardiovascular:  Negative for chest pain, palpitations and leg swelling.  Gastrointestinal:  Negative for abdominal pain, blood in stool, nausea and vomiting.  Endocrine: Negative for cold intolerance and heat intolerance.  Genitourinary:  Negative for decreased urine volume, dysuria, genital sores, hematuria and testicular pain.  Musculoskeletal:  Negative for arthralgias, back pain, joint swelling and myalgias.  Skin:  Negative for color change and rash.  Allergic/Immunologic: Positive for environmental allergies.  Neurological:  Negative for syncope, weakness and headaches.  Hematological:  Negative for adenopathy. Does not bruise/bleed easily.  Psychiatric/Behavioral:  Negative for  confusion. The patient is not nervous/anxious.   All other systems reviewed and are negative.  Current Outpatient Medications on File Prior to Visit  Medication Sig Dispense Refill   Continuous Blood Gluc Receiver (FREESTYLE LIBRE READER) DEVI 1 Device by Does not apply route as directed. 6  Device 2   fluticasone (FLONASE) 50 MCG/ACT nasal spray SPRAY 2 SPRAYS INTO EACH NOSTRIL EVERY DAY 16 mL 2   JARDIANCE 10 MG TABS tablet TAKE 1 TABLET BY MOUTH EVERY DAY BEFORE BREAKFAST 30 tablet 0   lovastatin (MEVACOR) 10 MG tablet TAKE 1 TABLET BY MOUTH EVERYDAY AT BEDTIME 90 tablet 3   metFORMIN (GLUCOPHAGE-XR) 500 MG 24 hr tablet Take 2 tablets (1,000 mg total) by mouth daily with supper. 60 tablet 0   tadalafil (CIALIS) 20 MG tablet TAKE 1 TABLET BY MOUTH EVERY DAY AS NEEDED FOR ERECTILE DYSFUNCTION 6 tablet 0   No current facility-administered medications on file prior to visit.     Past Medical History:  Diagnosis Date   Arthritis    left knee   Asthma    as child   Diabetes mellitus    type 2   Erectile dysfunction    Hypertension     Past Surgical History:  Procedure Laterality Date   meniscus tear     pt. 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   Procedure: LEFT TOTAL KNEE ARTHROPLASTY;  Surgeon: Susa Day, MD;  Location: [facility];  Service: Orthopedics;  Laterality: Left;  with block   WISDOM TOOTH EXTRACTION     with sedation    Allergies  Allergen Reactions   Other     Family History  Problem Relation Age of Onset   Cancer Father    Colon cancer Father        dx'd late 40's early 67's   Cancer Mother    Cancer Maternal Grandfather    Colon polyps Neg Hx    Rectal cancer Neg Hx    Stomach cancer Neg Hx     Social History   Socioeconomic History   Marital status: Married    Spouse name: Not on file   Number of children: Not on file   Years of education: Not on file   Highest education level: Not on file  Occupational History    Employer: [employer]  Tobacco Use   Smoking status: Never   Smokeless tobacco: Never  Substance and Sexual Activity   Alcohol use: No    Alcohol/week: 0.0 standard drinks   Drug use: No   Sexual activity: Not on file  Other Topics Concern   Not on file  Social  History Narrative   Not on file   Social Determinants of Health   Financial Resource Strain: Not on file  Food Insecurity: Not on file  Transportation Needs: Not on file  Physical Activity: Not on file  Stress: Not on file  Social Connections: Not on file   Vitals:   09/06/21 0826  BP: 130/84  Pulse: 80  Resp: 16  Temp: 98.1 F (36.7 C)  SpO2: 97%   Body mass index is 31.76 kg/m.  Wt Readings from Last 3 Encounters:  09/06/21 202 lb 12.8 oz (92 kg)  10/19/19 205 lb (93 kg)  04/20/19 203 lb (92.1 kg)   Physical Exam Vitals and nursing note reviewed.  Constitutional:      General: He is not in acute distress.  Appearance: He is well-developed and well-groomed.  HENT:     Head: Normocephalic and atraumatic.     Right Ear: Tympanic membrane, ear canal and external ear normal.     Left Ear: External ear normal.     Ears:     Comments: Excess cerumen left ear canal, could not see TM.    Mouth/Throat:     Mouth: Mucous membranes are moist.     Pharynx: Oropharynx is clear.  Eyes:     Extraocular Movements: Extraocular movements intact.     Conjunctiva/sclera: Conjunctivae normal.     Pupils: Pupils are equal, round, and reactive to light.  Neck:     Thyroid: No thyromegaly.     Trachea: No tracheal deviation.  Cardiovascular:     Rate and Rhythm: Normal rate and regular rhythm.     Pulses:          Dorsalis pedis pulses are 2+ on the right side and 2+ on the left side.     Heart sounds: No murmur heard. Pulmonary:     Effort: Pulmonary effort is normal. No respiratory distress.     Breath sounds: Normal breath sounds.  Abdominal:     Palpations: Abdomen is soft. There is no hepatomegaly or mass.     Tenderness: There is no abdominal tenderness.  Genitourinary:    Comments: No concerns. Musculoskeletal:        General: No tenderness.     Cervical back: Normal range of motion.     Comments: No major deformities appreciated and no signs of synovitis.   Lymphadenopathy:     Cervical: No cervical adenopathy.     Upper Body:     Right upper body: No supraclavicular adenopathy.     Left upper body: No supraclavicular adenopathy.  Skin:    General: Skin is warm.     Findings: No erythema.  Neurological:     General: No focal deficit present.     Mental Status: He is alert and oriented to person, place, and time.     Cranial Nerves: No cranial nerve deficit.     Sensory: No sensory deficit.     Coordination: Coordination normal.     Gait: Gait normal.     Deep Tendon Reflexes:     Reflex Scores:      Bicep reflexes are 2+ on the right side and 2+ on the left side.      Patellar reflexes are 2+ on the right side and 2+ on the left side. Psychiatric:        Mood and Affect: Mood and affect normal.   ASSESSMENT AND PLAN:  Mr.Adael was seen today for annual exam and follow-up.  Diagnoses and all orders for this visit: Orders Placed This Encounter  Procedures   Comprehensive metabolic panel   Hemoglobin A1c   Lipid panel   Microalbumin / creatinine urine ratio   PSA   Lab Results  Component Value Date   PSA 1.90 09/06/2021             Lab Results  Component Value Date   ALT 38 09/06/2021   AST 21 09/06/2021   ALKPHOS 92 09/06/2021   BILITOT 0.9 09/06/2021   Lab Results  Component Value Date   CREATININE 1.22 09/06/2021   BUN 22 09/06/2021   NA 138 09/06/2021   K 4.2 09/06/2021   CL 101 09/06/2021   CO2 29 09/06/2021   Lab Results  Component Value Date   MICROALBUR  6.9 (H) 09/06/2021   MICROALBUR 69.6 (H) 10/19/2019   Lab Results  Component Value Date   HGBA1C 7.3 (H) 09/06/2021   Lab Results  Component Value Date   CHOL 156 09/06/2021   HDL 31.70 (L) 09/06/2021   LDLCALC 100 (H) 09/06/2021   TRIG 121.0 09/06/2021   CHOLHDL 5 09/06/2021   Routine general medical examination at a health care facility We discussed the importance of regular physical activity and healthy diet for prevention of chronic  illness and/or complications. Preventive guidelines reviewed. Vaccination up to date. Due for colonoscopy. He received a reminder letter, phone # given,so he can call and schedule appt. Next CPE in a year.  Prostate cancer screening -     PSA  Type 2 diabetes mellitus with other specified complication ([place]) VOH6W has been at goal. Continue  Metformin XR 500 mg 2 tablets daily, Jardiance 10 mg daily. Regular exercise and healthy diet with avoidance of added sugar food intake encouraged. Annual eye exam, periodic dental and foot care recommended. F/U in 5-6 months   Pure hypercholesterolemia Continue Lovastatin 20 mg daily. Further recommendations according to FLP result.  Essential hypertension BP adequately controlled. Continue current management: Olmesartan-HCTZ 20-12.5 mg daily. DASH/low salt diet to continue. Continue monitoring BP regularly. Eye exam is current.  Class 1 obesity with serious comorbidity and body mass index (BMI) of 31.0 to 31.9 in adult Wt stable. We discussed benefits of wt loss. Consistency with healthy diet and physical activity encouraged.  Return in 6 months (on 03/06/2022).  Nelly Scriven G. Martinique, MD  [facility]. [place] office.

## 2021-09-06 ENCOUNTER — Encounter: Payer: Self-pay | Admitting: Family Medicine

## 2021-09-06 ENCOUNTER — Ambulatory Visit (INDEPENDENT_AMBULATORY_CARE_PROVIDER_SITE_OTHER): Payer: 59 | Admitting: Family Medicine

## 2021-09-06 VITALS — BP 130/84 | HR 80 | Temp 98.1°F | Resp 16 | Ht 67.0 in | Wt 202.8 lb

## 2021-09-06 DIAGNOSIS — I10 Essential (primary) hypertension: Secondary | ICD-10-CM

## 2021-09-06 DIAGNOSIS — E669 Obesity, unspecified: Secondary | ICD-10-CM

## 2021-09-06 DIAGNOSIS — E1169 Type 2 diabetes mellitus with other specified complication: Secondary | ICD-10-CM | POA: Diagnosis not present

## 2021-09-06 DIAGNOSIS — Z0000 Encounter for general adult medical examination without abnormal findings: Secondary | ICD-10-CM | POA: Diagnosis not present

## 2021-09-06 DIAGNOSIS — Z6831 Body mass index (BMI) 31.0-31.9, adult: Secondary | ICD-10-CM

## 2021-09-06 DIAGNOSIS — E7800 Pure hypercholesterolemia, unspecified: Secondary | ICD-10-CM

## 2021-09-06 DIAGNOSIS — Z125 Encounter for screening for malignant neoplasm of prostate: Secondary | ICD-10-CM

## 2021-09-06 DIAGNOSIS — E119 Type 2 diabetes mellitus without complications: Secondary | ICD-10-CM

## 2021-09-06 LAB — COMPREHENSIVE METABOLIC PANEL
ALT: 38 U/L (ref 0–53)
AST: 21 U/L (ref 0–37)
Albumin: 4.5 g/dL (ref 3.5–5.2)
Alkaline Phosphatase: 92 U/L (ref 39–117)
BUN: 22 mg/dL (ref 6–23)
CO2: 29 mEq/L (ref 19–32)
Calcium: 9.8 mg/dL (ref 8.4–10.5)
Chloride: 101 mEq/L (ref 96–112)
Creatinine, Ser: 1.22 mg/dL (ref 0.40–1.50)
GFR: 64.88 mL/min (ref >60.00)
Glucose, Bld: 256 mg/dL — ABNORMAL HIGH (ref 70–99)
Potassium: 4.2 mEq/L (ref 3.5–5.1)
Sodium: 138 mEq/L (ref 135–145)
Total Bilirubin: 0.9 mg/dL (ref 0.2–1.2)
Total Protein: 7.4 g/dL (ref 6.0–8.3)

## 2021-09-06 LAB — LIPID PANEL
Cholesterol: 156 mg/dL (ref 0–200)
HDL: 31.7 mg/dL — ABNORMAL LOW (ref >39.00)
LDL Cholesterol: 100 mg/dL — ABNORMAL HIGH (ref 0–99)
NonHDL: 124.25
Total CHOL/HDL Ratio: 5
Triglycerides: 121 mg/dL (ref 0.0–149.0)
VLDL: 24.2 mg/dL (ref 0.0–40.0)

## 2021-09-06 LAB — MICROALBUMIN / CREATININE URINE RATIO
Creatinine,U: 72.5 mg/dL
Microalb Creat Ratio: 9.4 mg/g (ref 0.0–30.0)
Microalb, Ur: 6.9 mg/dL — ABNORMAL HIGH (ref 0.0–1.9)

## 2021-09-06 LAB — HEMOGLOBIN A1C: Hgb A1c MFr Bld: 7.3 % — ABNORMAL HIGH (ref 4.6–6.5)

## 2021-09-06 LAB — PSA: PSA: 1.9 ng/mL (ref 0.10–4.00)

## 2021-09-06 MED ORDER — FREESTYLE LIBRE 14 DAY SENSOR MISC: 1 refills | Status: AC

## 2021-09-06 MED ORDER — OLMESARTAN MEDOXOMIL-HCTZ 20-12.5 MG PO TABS: 1.0000 | ORAL_TABLET | Freq: Every day | ORAL | 1 refills | Status: DC

## 2021-09-06 NOTE — Assessment & Plan Note (Signed)
BP adequately controlled. Continue current management: Olmesartan-HCTZ 20-12.5 mg daily. DASH/low salt diet to continue. Continue monitoring BP regularly. Eye exam is current.

## 2021-09-06 NOTE — Assessment & Plan Note (Signed)
Continue Lovastatin 20 mg daily. Further recommendations according to FLP result.

## 2021-09-06 NOTE — Patient Instructions (Addendum)
A few things to remember from today's visit:  Routine general medical examination at a health care facility  Controlled type 2 diabetes mellitus without complication, without long-term current use of insulin ([place]) - Plan: Comprehensive metabolic panel, Hemoglobin A1c, Microalbumin / creatinine urine ratio  Essential hypertension  Pure hypercholesterolemia - Plan: Lipid panel  Prostate cancer screening - Plan: PSA  If you need refills please call your pharmacy. Do not use My Chart to request refills or for acute issues that need immediate attention.   Please call GI office at [phone] to schedule this appointment or to update your records at your earliest convenience. Colonoscopy is due.  Please be sure medication list is accurate. If a new problem present, please set up appointment sooner than planned today.   Health Maintenance, Male Adopting a healthy lifestyle and getting preventive care are important in promoting health and wellness. Ask your health care provider about: The right schedule for you to have regular tests and exams. Things you can do on your own to prevent diseases and keep yourself healthy. What should I know about diet, weight, and exercise? Eat a healthy diet  Eat a diet that includes plenty of vegetables, fruits, low-fat dairy products, and lean protein. Do not eat a lot of foods that are high in solid fats, added sugars, or sodium. Maintain a healthy weight Body mass index (BMI) is a measurement that can be used to identify possible weight problems. It estimates body fat based on height and weight. Your health care provider can help determine your BMI and help you achieve or maintain a healthy weight. Get regular exercise Get regular exercise. This is one of the most important things you can do for your health. Most adults should: Exercise for at least 150 minutes each week. The exercise should increase your heart rate and make you sweat  (moderate-intensity exercise). Do strengthening exercises at least twice a week. This is in addition to the moderate-intensity exercise. Spend less time sitting. Even light physical activity can be beneficial. Watch cholesterol and blood lipids Have your blood tested for lipids and cholesterol at 59 years of age, then have this test every 5 years. You may need to have your cholesterol levels checked more often if: Your lipid or cholesterol levels are high. You are older than 59 years of age. You are at high risk for heart disease. What should I know about cancer screening? Many types of cancers can be detected early and may often be prevented. Depending on your health history and family history, you may need to have cancer screening at various ages. This may include screening for: Colorectal cancer. Prostate cancer. Skin cancer. Lung cancer. What should I know about heart disease, diabetes, and high blood pressure? Blood pressure and heart disease High blood pressure causes heart disease and increases the risk of stroke. This is more likely to develop in people who have high blood pressure readings or are overweight. Talk with your health care provider about your target blood pressure readings. Have your blood pressure checked: Every 3-5 years if you are 69-9 years of age. Every year if you are 10 years old or older. If you are between the ages of 81 and 32 and are a current or former smoker, ask your health care provider if you should have a one-time screening for abdominal aortic aneurysm (AAA). Diabetes Have regular diabetes screenings. This checks your fasting blood sugar level. Have the screening done: Once every three years after age 48  if you are at a normal weight and have a low risk for diabetes. More often and at a younger age if you are overweight or have a high risk for diabetes. What should I know about preventing infection? Hepatitis B If you have a higher risk for  hepatitis B, you should be screened for this virus. Talk with your health care provider to find out if you are at risk for hepatitis B infection. Hepatitis C Blood testing is recommended for: Everyone born from [birth year] through 1964. Anyone with known risk factors for hepatitis C. Sexually transmitted infections (STIs) You should be screened each year for STIs, including gonorrhea and chlamydia, if: You are sexually active and are younger than 59 years of age. You are older than 59 years of age and your health care provider tells you that you are at risk for this type of infection. Your sexual activity has changed since you were last screened, and you are at increased risk for chlamydia or gonorrhea. Ask your health care provider if you are at risk. Ask your health care provider about whether you are at high risk for HIV. Your health care provider may recommend a prescription medicine to help prevent HIV infection. If you choose to take medicine to prevent HIV, you should first get tested for HIV. You should then be tested every 3 months for as long as you are taking the medicine. Follow these instructions at home: Alcohol use Do not drink alcohol if your health care provider tells you not to drink. If you drink alcohol: Limit how much you have to 0-2 drinks a day. Know how much alcohol is in your drink. In the [place] one drink equals one 12 oz bottle of beer (355 mL), one 5 oz glass of wine (148 mL), or one 1 oz glass of hard liquor (44 mL). Lifestyle Do not use any products that contain nicotine or tobacco. These products include cigarettes, chewing tobacco, and vaping devices, such as e-cigarettes. If you need help quitting, ask your health care provider. Do not use street drugs. Do not share needles. Ask your health care provider for help if you need support or information about quitting drugs. General instructions Schedule regular health, dental, and eye exams. Stay current with your  vaccines. Tell your health care provider if: You often feel depressed. You have ever been abused or do not feel safe at home. Summary Adopting a healthy lifestyle and getting preventive care are important in promoting health and wellness. Follow your health care provider's instructions about healthy diet, exercising, and getting tested or screened for diseases. Follow your health care provider's instructions on monitoring your cholesterol and blood pressure. This information is not intended to replace advice given to you by your health care provider. Make sure you discuss any questions you have with your health care provider. Document Revised: 02/13/2021 Document Reviewed: 02/13/2021 Elsevier Patient Education  [employer].

## 2021-09-06 NOTE — Assessment & Plan Note (Signed)
Wt stable. We discussed benefits of wt loss. Consistency with healthy diet and physical activity encouraged.

## 2021-09-06 NOTE — Assessment & Plan Note (Signed)
HgA1C has been at goal. Continue  Metformin XR 500 mg 2 tablets daily, Jardiance 10 mg daily. Regular exercise and healthy diet with avoidance of added sugar food intake encouraged. Annual eye exam, periodic dental and foot care recommended. F/U in 5-6 months

## 2021-09-07 MED ORDER — METFORMIN HCL ER 500 MG PO TB24: 1000.0000 mg | ORAL_TABLET | Freq: Every day | ORAL | 1 refills | Status: DC

## 2021-09-07 MED ORDER — LOVASTATIN 10 MG PO TABS: ORAL_TABLET | ORAL | 3 refills | Status: DC

## 2021-09-08 ENCOUNTER — Other Ambulatory Visit: Payer: Self-pay

## 2021-09-08 MED ORDER — EMPAGLIFLOZIN 25 MG PO TABS
25.0000 mg | ORAL_TABLET | Freq: Every day | ORAL | 3 refills | Status: DC
Start: 2021-09-08 — End: 2022-01-08

## 2021-11-10 ENCOUNTER — Encounter: Payer: Self-pay | Admitting: Internal Medicine

## 2021-11-28 ENCOUNTER — Other Ambulatory Visit: Payer: Self-pay

## 2021-11-28 ENCOUNTER — Ambulatory Visit (AMBULATORY_SURGERY_CENTER): Payer: 59

## 2021-11-28 VITALS — Ht 67.0 in | Wt 204.0 lb

## 2021-11-28 DIAGNOSIS — Z800 Family history of malignant neoplasm of digestive organs: Secondary | ICD-10-CM

## 2021-11-28 DIAGNOSIS — Z1211 Encounter for screening for malignant neoplasm of colon: Secondary | ICD-10-CM

## 2021-11-28 MED ORDER — NA SULFATE-K SULFATE-MG SULF 17.5-3.13-1.6 GM/177ML PO SOLN: 1.0000 | Freq: Once | ORAL | 0 refills | Status: AC

## 2021-11-28 NOTE — Progress Notes (Signed)
No egg or soy allergy known to patient  No issues known to pt with past sedation with any surgeries or procedures Patient denies ever being told they had issues or difficulty with intubation  No FH of Malignant Hyperthermia Pt is not on diet pills Pt is not on home 02  Pt is not on blood thinners  Pt denies issues with constipation  No A fib or A flutter Pt is fully vaccinated for Covid x 2 + boosters; NO PA's for preps discussed with pt in PV today  Discussed with pt there will be an out-of-pocket cost for prep and that varies from $0 to 70 + dollars - pt verbalized understanding  Due to the COVID-19 pandemic we are asking patients to follow certain guidelines in PV and the [place]   Pt aware of COVID protocols and [facility] guidelines  PV completed over the phone. Pt verified name, DOB, address and insurance during PV today.  Pt mailed instruction packet with copy of consent form to read and not return, and instructions.  Pt encouraged to call with questions or issues.  If pt has My chart, procedure instructions sent via My Chart

## 2021-12-07 ENCOUNTER — Encounter: Payer: Self-pay | Admitting: Internal Medicine

## 2021-12-12 ENCOUNTER — Ambulatory Visit (AMBULATORY_SURGERY_CENTER): Payer: 59 | Admitting: Internal Medicine

## 2021-12-12 ENCOUNTER — Encounter: Payer: Self-pay | Admitting: Internal Medicine

## 2021-12-12 VITALS — BP 124/78 | HR 69 | Temp 97.3°F | Resp 18 | Ht 67.0 in | Wt 204.0 lb

## 2021-12-12 DIAGNOSIS — Z1211 Encounter for screening for malignant neoplasm of colon: Secondary | ICD-10-CM | POA: Diagnosis not present

## 2021-12-12 DIAGNOSIS — Z800 Family history of malignant neoplasm of digestive organs: Secondary | ICD-10-CM | POA: Diagnosis not present

## 2021-12-12 DIAGNOSIS — D122 Benign neoplasm of ascending colon: Secondary | ICD-10-CM

## 2021-12-12 HISTORY — PX: COLONOSCOPY: SHX174

## 2021-12-12 MED ORDER — SODIUM CHLORIDE 0.9 % IV SOLN: 500.0000 mL | Freq: Once | INTRAVENOUS | Status: DC

## 2021-12-12 NOTE — Patient Instructions (Signed)
?HANDOUT ON POLYPS GIVEN TO YOU TODAY ? ?AWAIT PATHOLOGY RESULTS ON POLYP REMOVED ? ?REPEAT COLONOSCOPY IN ONE YEAR WITH MORE EXTENSIVE PREP  ? ? ? ? ?YOU HAD AN ENDOSCOPIC PROCEDURE TODAY AT [facility]:   Refer to the procedure report that was given to you for any specific questions about what was found during the examination.  If the procedure report does not answer your questions, please call your gastroenterologist to clarify.  If you requested that your care partner not be given the details of your procedure findings, then the procedure report has been included in a sealed envelope for you to review at your convenience later. ? ?YOU SHOULD EXPECT: Some feelings of bloating in the abdomen. Passage of more gas than usual.  Walking can help get rid of the air that was put into your GI tract during the procedure and reduce the bloating. If you had a lower endoscopy (such as a colonoscopy or flexible sigmoidoscopy) you may notice spotting of blood in your stool or on the toilet paper. If you underwent a bowel prep for your procedure, you may not have a normal bowel movement for a few days. ? ?Please Note:  You might notice some irritation and congestion in your nose or some drainage.  This is from the oxygen used during your procedure.  There is no need for concern and it should clear up in a day or so. ? ?SYMPTOMS TO REPORT IMMEDIATELY: ? ?Following lower endoscopy (colonoscopy or flexible sigmoidoscopy): ? Excessive amounts of blood in the stool ? Significant tenderness or worsening of abdominal pains ? Swelling of the abdomen that is new, acute ? Fever of 100?F or higher ? ?Following upper endoscopy (EGD) ? Vomiting of blood or coffee ground material ? New chest pain or pain under the shoulder blades ? Painful or persistently difficult swallowing ? New shortness of breath ? Fever of 100?F or higher ? Black, tarry-looking stools ? ?For urgent or emergent issues, a gastroenterologist can be  reached at any hour by calling [phone]. ?Do not use MyChart messaging for urgent concerns.  ? ? ?DIET:  We do recommend a small meal at first, but then you may proceed to your regular diet.  Drink plenty of fluids but you should avoid alcoholic beverages for 24 hours. ? ?ACTIVITY:  You should plan to take it easy for the rest of today and you should NOT DRIVE or use heavy machinery until tomorrow (because of the sedation medicines used during the test).   ? ?FOLLOW UP: ?Our staff will call the number listed on your records 48-72 hours following your procedure to check on you and address any questions or concerns that you may have regarding the information given to you following your procedure. If we do not reach you, we will leave a message.  We will attempt to reach you two times.  During this call, we will ask if you have developed any symptoms of COVID 19. If you develop any symptoms (ie: fever, flu-like symptoms, shortness of breath, cough etc.) before then, please call [phone].  If you test positive for Covid 19 in the 2 weeks post procedure, please call and report this information to [place].   ? ?If any biopsies were taken you will be contacted by phone or by letter within the next 1-3 weeks.  Please call us at [phone] if you have not heard about the biopsies in 3 weeks.  ? ? ?SIGNATURES/CONFIDENTIALITY: ?You and/or your  care partner have signed paperwork which will be entered into your electronic medical record.  These signatures attest to the fact that that the information above on your After Visit Summary has been reviewed and is understood.  Full responsibility of the confidentiality of this discharge information lies with you and/or your care-partner.  ?

## 2021-12-12 NOTE — Op Note (Signed)
[name]: Brent Cook ?Procedure Date: 12/12/2021 3:39 PM ?MRN: [id number] ?Endoscopist: Docia Chuck. Henrene Pastor , MD ?Age: 61 ?Referring MD:  ?Date of Birth: 23-Feb-1961 ?Gender: Male ?Account #: [id number] ?Procedure:                Colonoscopy with cold snare polypectomy x 1 ?Indications:              Colon cancer screening in patient at increased  ?                          risk: Colorectal cancer in father (60???70). Previous  ?                          examination February 2017 was normal ?Medicines:                Monitored Anesthesia Care ?Procedure:                Pre-Anesthesia Assessment: ?                          - Prior to the procedure, a History and Physical  ?                          was performed, and patient medications and  ?                          allergies were reviewed. The patient's tolerance of  ?                          previous anesthesia was also reviewed. The risks  ?                          and benefits of the procedure and the sedation  ?                          options and risks were discussed with the patient.  ?                          All questions were answered, and informed consent  ?                          was obtained. Prior Anticoagulants: The patient has  ?                          taken no previous anticoagulant or antiplatelet  ?                          agents. ASA Grade Assessment: II - A patient with  ?                          mild systemic disease. After reviewing the risks  ?                          and benefits, the patient was deemed in  ?  satisfactory condition to undergo the procedure. ?                          After obtaining informed consent, the colonoscope  ?                          was passed under direct vision. Throughout the  ?                          procedure, the patient's blood pressure, pulse, and  ?                          oxygen saturations were monitored continuously. The  ?                           Colonoscope was introduced through the anus and  ?                          advanced to the the cecum, identified by  ?                          appendiceal orifice and ileocecal valve. The  ?                          ileocecal valve, appendiceal orifice, and rectum  ?                          were photographed. The quality of the bowel  ?                          preparation was fair. The colonoscopy was performed  ?                          without difficulty. The patient tolerated the  ?                          procedure well. The bowel preparation used was  ?                          SUPREP via split dose instruction. ?Scope In: 4:02:40 PM ?Scope Out: 4:26:58 PM ?Scope Withdrawal Time: 0 hours 20 minutes 26 seconds  ?Total Procedure Duration: 0 hours 24 minutes 18 seconds  ?Findings:                 A 2 mm polyp was found in the ascending colon. The  ?                          polyp was removed with a cold snare. Resection and  ?                          retrieval were complete. ?                          The exam was otherwise without abnormality on  ?  direct and retroflexion views. ?Complications:            No immediate complications. Estimated blood loss:  ?                          None. ?Estimated Blood Loss:     Estimated blood loss: none. ?Impression:               - Preparation of the colon was fair. ?                          - One 2 mm polyp in the ascending colon, removed  ?                          with a cold snare. Resected and retrieved. ?                          - The examination was otherwise normal on direct  ?                          and retroflexion views. ?Recommendation:           - Repeat colonoscopy in 1 year for surveillance.  ?                          MORE EXTENSIVE PREP ?                          - Patient has a contact number available for  ?                          emergencies. The signs and symptoms of potential  ?                          delayed  complications were discussed with the  ?                          patient. Return to normal activities tomorrow.  ?                          Written discharge instructions were provided to the  ?                          patient. ?                          - Resume previous diet. ?                          - Continue present medications. ?                          - Await pathology results. ?Docia Chuck. Henrene Pastor, MD ?12/12/2021 4:32:17 PM ?This report has been signed electronically. ?

## 2021-12-12 NOTE — Progress Notes (Signed)
Called to room to assist during endoscopic procedure.  Patient ID and intended procedure confirmed with present staff. Received instructions for my participation in the procedure from the performing physician.  

## 2021-12-12 NOTE — Progress Notes (Signed)
Sedate, gd SR, tolerated procedure well, VSS, report to RN 

## 2021-12-12 NOTE — Progress Notes (Signed)
Pt's states no medical or surgical changes since previsit or office visit.  ° °VS DT °

## 2021-12-12 NOTE — Progress Notes (Signed)
HISTORY OF PRESENT ILLNESS: ? ?Brent Cook is a 61 y.o. male who presents today for screening colonoscopy.  Index examination February 2017 was normal.  His father has a history of colon cancer in his 39s.  Patient has no active complaints.  Tolerated his prep ? ?REVIEW OF SYSTEMS: ? ?All non-GI ROS negative. ?Past Medical History:  ?Diagnosis Date  ? Arthritis   ? left knee  ? Asthma   ? as child  ? Depression   ? hx of-meets with therapist as needed  ? Diabetes mellitus   ? type 2-on meds  ? Erectile dysfunction   ? Hyperlipidemia   ? on meds  ? Hypertension   ? on meds  ? ? ?Past Surgical History:  ?Procedure Laterality Date  ? COLONOSCOPY  12/12/2021  ? also 2017- normal [facility]  ? meniscus tear    ? pt. reported 10-15 years ago  ? TONSILLECTOMY    ? childhood  ? 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 Procedure: LEFT TOTAL KNEE ARTHROPLASTY;  Surgeon: Susa Day, MD;  Location: [facility];  Service: Orthopedics;  Laterality: Left;  with block  ? WISDOM TOOTH EXTRACTION    ? with sedation  ? ? ?Social History ?Brent Cook  reports that he has never smoked. He has never been exposed to tobacco smoke. He has never used smokeless tobacco. He reports that he does not drink alcohol and does not use drugs. ? ?family history includes Cancer in his father, maternal grandfather, and mother; Colon cancer in his father; Colon polyps (age of onset: 45) in his father. ? ?Allergies  ?Allergen Reactions  ? Lactose Diarrhea  ?  Other reaction(s): Abdominal pain  ? Other   ? Cat Hair Extract Rash  ? ? ?  ? ?PHYSICAL EXAMINATION: ? ?Vital signs: BP 118/68   Pulse 82   Temp (!) 97.3 ?F (36.3 ?C) (Temporal)   Ht '5\' 7"'$  (1.702 m)   Wt 204 lb (92.5 kg)   SpO2 98%   BMI 31.95 kg/m?  ?General: Well-developed, well-nourished, no acute distress ?HEENT: Sclerae are anicteric, conjunctiva pink. Oral mucosa intact ?Lungs: Clear ?Heart: Regular ?Abdomen: soft, nontender, nondistended, no obvious ascites, no peritoneal signs,  normal bowel sounds. No organomegaly. ?Extremities: No edema ?Psychiatric: alert and oriented x3. Cooperative  ? ? ? ?ASSESSMENT: ? ?Family history of colon cancer in first-degree relative.  Negative index exam 2017.  Due for follow-up screening ? ? ?PLAN: ? ? ?Screening colonoscopy ? ? ? ?  ?

## 2021-12-14 ENCOUNTER — Telehealth: Payer: Self-pay | Admitting: *Deleted

## 2021-12-14 NOTE — Telephone Encounter (Signed)
?  Follow up Call- ? ?Call back number 12/12/2021  ?Post procedure Call Back phone  # [phone]  ?Permission to leave phone message Yes  ?Some recent data might be hidden  ?  ? ?Patient questions: ? ?Do you have a fever, pain , or abdominal swelling? No. ?Pain Score  0 * ? ?Have you tolerated food without any problems? Yes.   ? ?Have you been able to return to your normal activities? Yes.   ? ?Do you have any questions about your discharge instructions: ?Diet   No. ?Medications  No. ?Follow up visit  No. ? ?Do you have questions or concerns about your Care? No. ? ?Actions: ?* If pain score is 4 or above: ?No action needed, pain <4. ? ? ?

## 2021-12-16 ENCOUNTER — Encounter: Payer: Self-pay | Admitting: Internal Medicine

## 2022-01-06 ENCOUNTER — Other Ambulatory Visit: Payer: Self-pay | Admitting: Family Medicine

## 2022-01-06 DIAGNOSIS — E119 Type 2 diabetes mellitus without complications: Secondary | ICD-10-CM

## 2022-01-06 DIAGNOSIS — I10 Essential (primary) hypertension: Secondary | ICD-10-CM

## 2022-03-17 ENCOUNTER — Other Ambulatory Visit: Payer: Self-pay | Admitting: Family Medicine

## 2022-06-12 IMAGING — CR DG SHOULDER 2+V*R*
3 series · 3 of 3 positions shown · non-contrast
Comparison: None.

CLINICAL DATA: 59-year-old male with pain over the right AC joint.

EXAM:
RIGHT SHOULDER - 2+ VIEW

[w shoulder ap internal righ *]
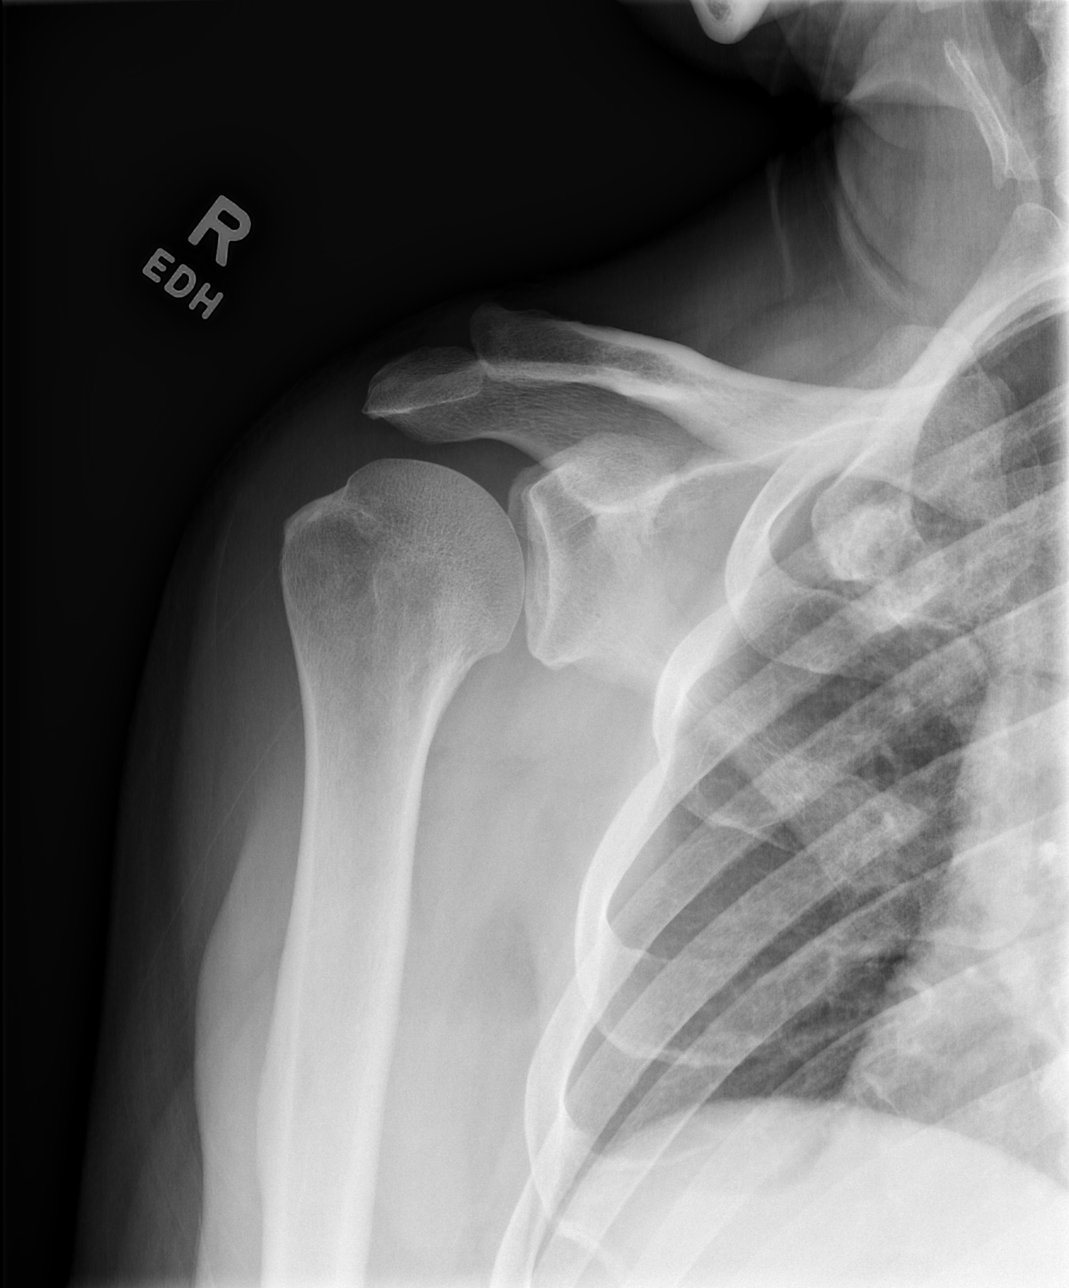

[w shoulder y view right *]
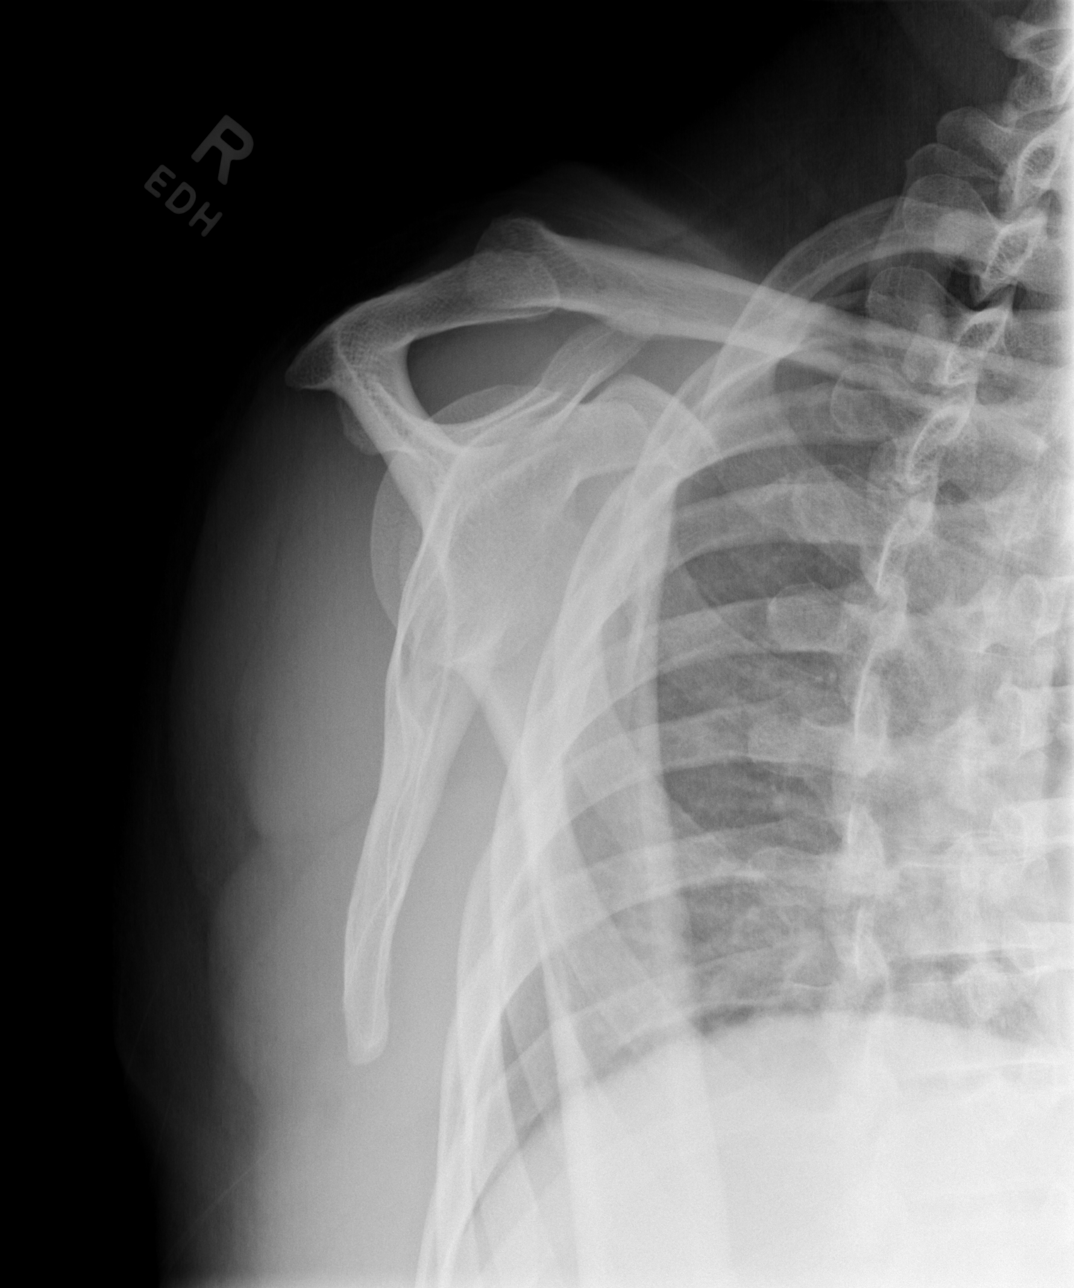

[w shoulder axillary right *]
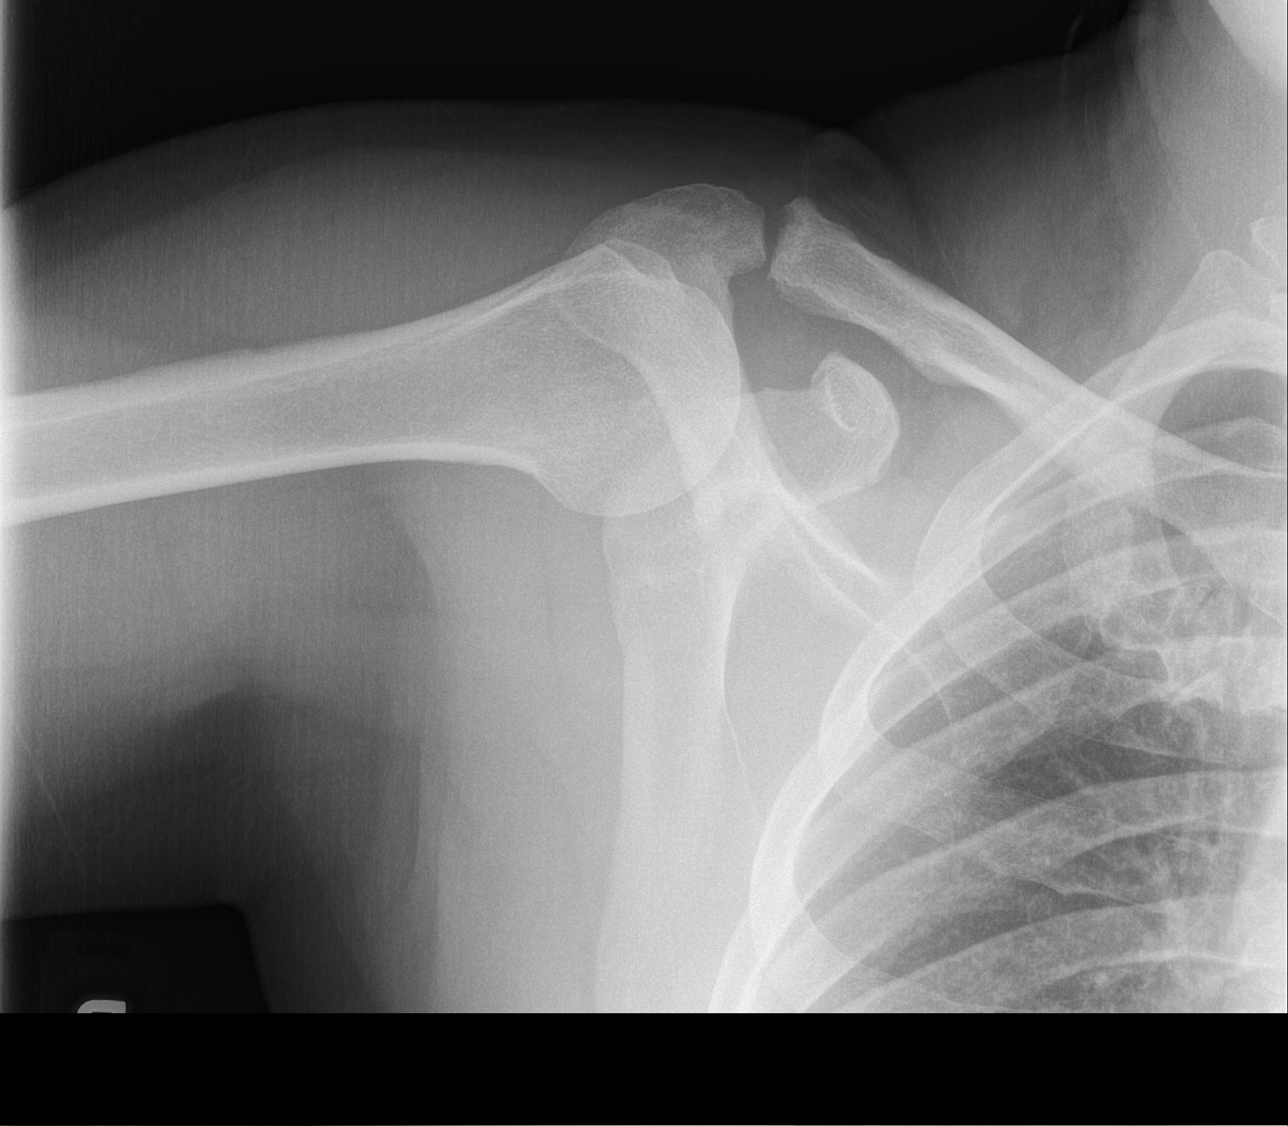

[3 of 3 positions shown; findings below may reference images not displayed]

FINDINGS: There is no acute fracture or dislocation. The bones are well
mineralized. There is mild degenerative changes of the right AC
joint with slight irregularity and spurring. The soft tissues are
unremarkable.
IMPRESSION: 1. No acute fracture or dislocation.
2. Degenerative changes of the right AC joint.

## 2022-08-23 LAB — HEMOGLOBIN A1C: Hemoglobin A1C: 8.9

## 2022-09-02 ENCOUNTER — Other Ambulatory Visit: Payer: Self-pay | Admitting: Family Medicine

## 2022-09-02 DIAGNOSIS — I10 Essential (primary) hypertension: Secondary | ICD-10-CM

## 2022-09-10 ENCOUNTER — Other Ambulatory Visit: Payer: Self-pay | Admitting: Family Medicine

## 2022-09-10 DIAGNOSIS — E119 Type 2 diabetes mellitus without complications: Secondary | ICD-10-CM

## 2022-09-10 DIAGNOSIS — E7800 Pure hypercholesterolemia, unspecified: Secondary | ICD-10-CM

## 2022-11-02 NOTE — Progress Notes (Signed)
HPI: Mr. Brent Cook is a 62 y.o.male with PMHx significant for hypertension, hyperlipidemia, DM 2, OA,and ED here today for his routine physical examination.  Last CPE: 09/06/2021  He states that he exercises regularly, walking and doing lightweight workouts twice per week. He believes his diet is good, including daily vegetables and water intake.  He reports sleeping an average of seven hours per night.  He denies any history of smoking and has abstained from alcohol for 30 years.  Immunization History  Administered Date(s) Administered   Covid-19, Mrna,Vaccine(Spikevax)104yrs and older 09/13/2022   Influenza,inj,Quad PF,6+ Mos 10/12/2014, 07/04/2020, 09/07/2021   Moderna Sars-Covid-2 Vaccination 01/06/2020, 02/02/2020, 08/22/2020   Pneumococcal Conjugate-13 12/05/2011, 06/04/2014   Pneumococcal Polysaccharide-23 01/30/2014, 10/12/2014   Td 10/08/2001   Tdap 11/07/2011, 10/08/2012   Zoster Recombinat (Shingrix) 05/14/2017, 11/11/2017   Health Maintenance  Topic Date Due   OPHTHALMOLOGY EXAM  10/26/2016   Diabetic kidney evaluation - eGFR measurement  09/06/2022   Diabetic kidney evaluation - Urine ACR  09/06/2022   FOOT EXAM  09/06/2022   DTaP/Tdap/Td (4 - Td or Tdap) 10/08/2022   COVID-19 Vaccine (5 - 2023-24 season) 11/21/2022 (Originally 11/08/2022)   INFLUENZA VACCINE  01/06/2023 (Originally 05/08/2022)   HIV Screening  09/06/2032 (Originally 02/19/1976)   COLONOSCOPY (Pts 45-80yrs Insurance coverage will need to be confirmed)  12/13/2022   HEMOGLOBIN A1C  02/21/2023   Hepatitis C Screening  Completed   Zoster Vaccines- Shingrix  Completed   HPV VACCINES  Aged Out   Chronic medical problems are followed at the [place]. He was seen at the [place] in 08/2022.  08/23/22 TSH 0.96  0:33 AM [facility] COMPREHENSIVE PANEL (CMP)       CREATININE,PLASMA (in mg/dL) 9.604   5.4-0.9        UREA NITROGEN 17   5-23        GLUCOSE 243 H 74-118        SODIUM 134   134.0-146.0         POTASSIUM 4.3   3.4-5.0        CHLORIDE 102   99-109        CO2 30   22.0-32.0        CALCIUM 9.4   8.5-10.5        PHOSPHATE 2.9   2.4-4.7        PROTEIN,PLASMA 8.0   6.1-8.1        ALBUMIN 3.5   3.2-5.0        BILIRUBIN,TOTAL 0.6   0.4-2.0        BIIRUBIN,DIRECT 0.2   0.0-0.2        ALKALINE PHOSPHATASE(VA) 101   45-117        AST(SGOT) 14   3-36        ALT(SGPT) 27   12-78        eGFR_CKD 69   See_Comment    Aug 23, 2022 10:33 AM [facility] PROSTATIC SPECIFIC ANTIGEN(VA-2014)  4.49  H   DM II: He is taking Jardiance 25 mg daily and metformin XR 1000 mg daily.    HGB A1C 8.9 H 4.1-6.5    Aug 23, 2022 10:33 AM [facility] MICROALB/CREAT URINE PANEL     CREATININE,RANDOM URINE 225 H 30-150         MICROALBUMIN,QUANTITATIVE 12.1 H 0.5-2.4        MICROALB/CREAT RATIO RESULT 53.8   See_Co      CHOLESTEROL 133   50-200  TRIGLYCERIDES 105   30-200        HDL CHOLESTEROL 27 L 29.0-71.0        CALCULATED LDL-CHOLESTEROL 85   60-130    Aug 23, 2022 10:33 AM [facility]        Vitamin D deficiency: He is not on vitamin D supplementation. Hyperlipidemia on lovastatin 10 mg daily.  Date/Time Source Result Type Result - Unit Interpretation Reference Range Comment  Aug 23, 2022 10:33 AM [facility] TSH       TSH 0.96   0.34-3.77    Aug 23, 2022 10:33 AM [facility] VITAMIN D (25_OH)VA       VITAMIN D (25_OH)VA 28.68   20.00-100.00    Aug 23, 2022 10:33 AM [facility] LIPID PROFILE       CHOLESTEROL 133   50-200        TRIGLYCERIDES 105   30-200        HDL CHOLESTEROL 27 L 29.0-71.0        CALCULATED LDL-CHOLESTEROL 85   60-130    Aug 23, 2022 10:33 AM [facility] CBC & DIFF       WBC 6.49   4.80-10.80        RBC 4.11 L 4.70-6.10        HGB 13.2 L 14.0-18.0        HCT 35.8 L 42.0-52.0        MCV 87.1   80.0-94.0        MCH 32.1 H 27.0-31.0        MCHC 36.9   33.0-37.0        RDW 11.5   11.0-14.0         MPV 10.1   7.4-10.4        PLT COUNT 294   130-400        NEUTROPHILS #(A) 4.77   1.40-6.50        NEUTROPHILS %(A) 73.5            LYMPHOCYTES #(A) 1.17 L 1.20-3.40        LYMPHOCYTES %(A) 18.0            MONOCYTES #(A) 0.40   0.20-1.00        MONOCYTES %(A) 6.2            EOSINOPHILS #(A) 0.09   0.00-0.70        EOSINOPHILS %(A) 1.4            BASOPHILS #(A) 0.04   0.00-0.20        BASOPHILS %(A) 0.6            IMMATURE GRANS #(A) 0.02   0.01-0.03        IMMATURE GRANS %(A) 0.3            NRBC #(A) 0.00   <0.00        NRBC %(A) 0.0        Aug 23, 2022 10:33 AM [facility] COMPREHENSIVE PANEL (CMP)       CREATININE,PLASMA (in mg/dL) 9.811   9.1-4.7        UREA NITROGEN 17   5-23        GLUCOSE 243 H 74-118        SODIUM 134   134.0-146.0        POTASSIUM 4.3   3.4-5.0        CHLORIDE 102   99-109        CO2  30   22.0-32.0        CALCIUM 9.4   8.5-10.5        PHOSPHATE 2.9   2.4-4.7        PROTEIN,PLASMA 8.0   6.1-8.1        ALBUMIN 3.5   3.2-5.0        BILIRUBIN,TOTAL 0.6   0.4-2.0        BIIRUBIN,DIRECT 0.2   0.0-0.2        ALKALINE PHOSPHATASE(VA) 101   45-117        AST(SGOT) 14   3-36        ALT(SGPT) 27   12-78        eGFR_CKD 69   See_Comment    Aug 23, 2022 10:33 AM [facility] PROSTATIC SPECIFIC ANTIGEN(VA-2014)       PROSTATIC SPECIFIC ANTIGEN(VA-2014) 4.490 H 0.01-4.0    Aug 23, 2022 10:33 AM [facility] URINALYSIS       UR COLOR Yellow   Colorless - Yellow        UR SP.GRAVITY 1.018   1.002-1.030        UR PH 5   5-9        UR.WBC/HPF 1   0-5        UR.HYALINE CASTS/LPF 1   0-2        UR.RBC/HPF 1   0-5        UR CLARITY Slightly-Cloudy H Clear        UR.SPERM Rare H Negative        UR UROBILINOGEN <2.0   0.0-2.0        UR BLOOD Negative   Negative        UR GLUCOSE Negative   Negative        UR BILIRUBIN Negative   Negative        UR KETONES Negative   Negative        UR PROTEIN 1+ H Negative        UR NITRITE  Negative   Negative        UR LEUKOCYTE ESTERASE Negative   Negative        UR.MUCUS Trace H Negative    Aug 23, 2022 10:33 AM [facility] HGB A1C       HGB A1C 8.9 H 4.1-6.5    Aug 23, 2022 10:33 AM [facility] MICROALB/CREAT URINE PANEL       CREATININE,RANDOM URINE 225 H 30-150        MICROALBUMIN,QUANTITATIVE 12.1 H 0.5-2.4        MICROALB/CREAT RATIO RESULT 53.8   See_Comment             MAGNESIUM 2.1   1.80-2.5    Aug 23, 2022 10:33 AM [facility]        Hypertension on olmesartan-HCTZ 20-12.5 mg daily.  In regard to mildly elevated PSA, he states that [place] provider is planning on rechecking it next visit. He denies nocturia, increasing urinary frequency, or gross hematuria.  He confirms having received the shingles vaccine and this season's flu shot at the [place].   Review of Systems  Constitutional:  Negative for activity change, appetite change and fever.  HENT:  Negative for nosebleeds, sore throat and trouble swallowing.   Eyes:  Negative for redness and visual disturbance.  Respiratory:  Negative for cough, shortness of breath and wheezing.   Cardiovascular:  Negative for chest pain, palpitations and leg swelling.  Gastrointestinal:  Negative for abdominal pain, blood in stool, nausea and vomiting.  Endocrine: Negative for cold intolerance, heat intolerance, polydipsia, polyphagia and polyuria.  Genitourinary:  Negative for decreased urine volume, dysuria, genital sores, hematuria and testicular pain.  Musculoskeletal:  Negative for gait problem and myalgias.  Skin:  Negative for color change and rash.  Allergic/Immunologic: Positive for environmental allergies.  Neurological:  Negative for dizziness, syncope, weakness, numbness and headaches.  Hematological:  Negative for adenopathy. Does not bruise/bleed easily.  Psychiatric/Behavioral:  Negative for confusion. The patient is not nervous/anxious.    Current Outpatient Medications on File  Prior to Visit  Medication Sig Dispense Refill   Continuous Blood Gluc Receiver (FREESTYLE LIBRE READER) DEVI 1 Device by Does not apply route as directed. 6 Device 2   Continuous Blood Gluc Sensor (FREESTYLE LIBRE 14 DAY SENSOR) MISC USE AS DIRECTED 6 each 1   fluticasone (FLONASE) 50 MCG/ACT nasal spray SPRAY 2 SPRAYS INTO EACH NOSTRIL EVERY DAY (Patient taking differently: Place 2 sprays into both nostrils daily as needed.) 16 mL 2   JARDIANCE 25 MG TABS tablet TAKE 1 TABLET BY MOUTH DAILY BEFORE BREAKFAST. 30 tablet 3   lovastatin (MEVACOR) 10 MG tablet TAKE 1 TABLET BY MOUTH EVERYDAY AT BEDTIME 90 tablet 0   metFORMIN (GLUCOPHAGE-XR) 500 MG 24 hr tablet TAKE 2 TABLETS (1,000 MG TOTAL) BY MOUTH DAILY WITH SUPPER 180 tablet 1   olmesartan-hydrochlorothiazide (BENICAR HCT) 20-12.5 MG tablet TAKE 1 TABLET BY MOUTH EVERY DAY 90 tablet 1   tadalafil (CIALIS) 20 MG tablet TAKE 1 TABLET BY MOUTH EVERY DAY AS NEEDED FOR ERECTILE DYSFUNCTION 6 tablet 0   traZODone (DESYREL) 50 MG tablet Take 1 tablet by mouth daily as needed.     No current facility-administered medications on file prior to visit.   Past Medical History:  Diagnosis Date   Arthritis    left knee   Asthma    as child   Depression    hx of-meets with therapist as needed   Diabetes mellitus    type 2-on meds   Erectile dysfunction    Hyperlipidemia    on meds   Hypertension    on meds   Past Surgical History:  Procedure Laterality Date   COLONOSCOPY  12/12/2021   also 2017- normal [facility]   meniscus tear     pt. 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   Procedure: LEFT TOTAL KNEE ARTHROPLASTY;  Surgeon: Jene Every, MD;  Location: [facility];  Service: Orthopedics;  Laterality: Left;  with block   WISDOM TOOTH EXTRACTION     with sedation   Allergies  Allergen Reactions   Lactose Diarrhea    Other reaction(s): Abdominal pain   Other    Cat Hair Extract Rash    Family History  Problem Relation Age of Onset   Cancer Mother    Colon polyps Father 16   Cancer Father    Colon cancer Father        dx'd late 8's early 70's   Cancer Maternal Grandfather    Rectal cancer Neg Hx    Stomach cancer Neg Hx    Esophageal cancer Neg Hx     Social History   Socioeconomic History   Marital status: Married    Spouse name: Not on file   Number of children: Not on file   Years of education: Not on file   Highest education level:  Not on file  Occupational History    Employer: [employer]  Tobacco Use   Smoking status: Never    Passive exposure: Never   Smokeless tobacco: Never  Vaping Use   Vaping Use: Never used  Substance and Sexual Activity   Alcohol use: Never   Drug use: Never   Sexual activity: Yes  Other Topics Concern   Not on file  Social History Narrative   Not on file   Social Determinants of Health   Financial Resource Strain: Not on file  Food Insecurity: Not on file  Transportation Needs: Not on file  Physical Activity: Not on file  Stress: Not on file  Social Connections: Not on file   Vitals:   11/05/22 0727  BP: 126/80  Pulse: 94  Resp: 16  Temp: 98.5 F (36.9 C)  SpO2: 96%  Body mass index is 31.19 kg/m. Wt Readings from Last 3 Encounters:  11/05/22 199 lb 2 oz (90.3 kg)  12/12/21 204 lb (92.5 kg)  11/28/21 204 lb (92.5 kg)   Physical Exam Vitals and nursing note reviewed.  Constitutional:      General: He is not in acute distress.    Appearance: He is well-developed.  HENT:     Head: Normocephalic and atraumatic.     Right Ear: Tympanic membrane, ear canal and external ear normal.     Left Ear: External ear normal. Tympanic membrane is not erythematous.     Ears:     Comments: Left ear canal excessive vomiting, can see TM partially.    Mouth/Throat:     Mouth: Mucous membranes are moist.     Pharynx: Oropharynx is clear.  Eyes:     Extraocular Movements: Extraocular movements intact.      Conjunctiva/sclera: Conjunctivae normal.     Pupils: Pupils are equal, round, and reactive to light.  Neck:     Thyroid: No thyromegaly.     Trachea: No tracheal deviation.  Cardiovascular:     Rate and Rhythm: Normal rate and regular rhythm.     Pulses:          Dorsalis pedis pulses are 2+ on the right side and 2+ on the left side.     Heart sounds: No murmur heard. Pulmonary:     Effort: Pulmonary effort is normal. No respiratory distress.     Breath sounds: Normal breath sounds.  Abdominal:     Palpations: Abdomen is soft. There is no hepatomegaly or mass.     Tenderness: There is no abdominal tenderness.  Genitourinary:    Comments: No concerns. Musculoskeletal:        General: No tenderness.     Cervical back: Normal range of motion.     Comments: No signs of synovitis.  Lymphadenopathy:     Cervical: No cervical adenopathy.     Upper Body:     Right upper body: No supraclavicular adenopathy.     Left upper body: No supraclavicular adenopathy.  Skin:    General: Skin is warm.     Findings: No erythema.  Neurological:     General: No focal deficit present.     Mental Status: He is alert and oriented to person, place, and time.     Cranial Nerves: No cranial nerve deficit.     Sensory: No sensory deficit.     Gait: Gait normal.     Deep Tendon Reflexes:     Reflex Scores:      Bicep reflexes are 2+ on  the right side and 2+ on the left side.      Patellar reflexes are 2+ on the right side and 2+ on the left side. Psychiatric:        Mood and Affect: Mood and affect normal.   ASSESSMENT AND PLAN:  Mr. Brent was seen today for annual exam.  Diagnoses and all orders for this visit:  Routine general medical examination at a health care facility Assessment & Plan: We discussed the importance of regular physical activity and healthy diet for prevention of chronic illness and/or complications. Preventive guidelines reviewed. Vaccination reported as up-to-date,  getting vaccination through the [place]. Next CPE in a year.   Essential hypertension Assessment & Plan: BP adequately controlled. Continue Olmesartan-HCTZ 20-12.5 mg daily. Follows with the [place].   Pure hypercholesterolemia Assessment & Plan: On Lovastatin 10 mg daily. Last FLP at the [facility] 08/2022, LDL 85.   Type 2 diabetes mellitus with other specified complication, without long-term current use of insulin (HCC) Assessment & Plan: HgA1C was nit at goal when checked in 08/2022, 8.9. [facility] exam in 08/2022.   Elevated PSA measurement Assessment & Plan: Slightly elevated in 08/2022. Asymptomatic. According to patient, the plan is to repeat PSA next follow-up visit at the [place].   Return in 1 year (on 11/06/2023) for CPE.  Leaner Morici G. [place], MD  [facility]. [place] office.

## 2022-11-05 ENCOUNTER — Encounter: Payer: Self-pay | Admitting: Family Medicine

## 2022-11-05 ENCOUNTER — Ambulatory Visit (INDEPENDENT_AMBULATORY_CARE_PROVIDER_SITE_OTHER): Payer: 59 | Admitting: Family Medicine

## 2022-11-05 VITALS — BP 126/80 | HR 94 | Temp 98.5°F | Resp 16 | Ht 67.0 in | Wt 199.1 lb

## 2022-11-05 DIAGNOSIS — E1169 Type 2 diabetes mellitus with other specified complication: Secondary | ICD-10-CM | POA: Diagnosis not present

## 2022-11-05 DIAGNOSIS — Z0000 Encounter for general adult medical examination without abnormal findings: Secondary | ICD-10-CM | POA: Diagnosis not present

## 2022-11-05 DIAGNOSIS — I10 Essential (primary) hypertension: Secondary | ICD-10-CM

## 2022-11-05 DIAGNOSIS — Z125 Encounter for screening for malignant neoplasm of prostate: Secondary | ICD-10-CM

## 2022-11-05 DIAGNOSIS — E7800 Pure hypercholesterolemia, unspecified: Secondary | ICD-10-CM | POA: Diagnosis not present

## 2022-11-05 DIAGNOSIS — R972 Elevated prostate specific antigen [PSA]: Secondary | ICD-10-CM

## 2022-11-05 NOTE — Assessment & Plan Note (Signed)
Slightly elevated in 08/2022. Asymptomatic. According to patient, the plan is to repeat PSA next follow-up visit at the [place].

## 2022-11-05 NOTE — Patient Instructions (Addendum)
A few things to remember from today's visit:  Routine general medical examination at a health care facility  Essential hypertension  Pure hypercholesterolemia  Type 2 diabetes mellitus with other specified complication, without long-term current use of insulin ([place])  Prostate cancer screening  For low vit D:Over the counter vit D 1000 U daily. Continue following at the [facility] for your chronic health problems and blood work. Please be sure medication list is accurate. If a new problem present, please set up appointment sooner than planned today.  Health Maintenance, Male Adopting a healthy lifestyle and getting preventive care are important in promoting health and wellness. Ask your health care provider about: The right schedule for you to have regular tests and exams. Things you can do on your own to prevent diseases and keep yourself healthy. What should I know about diet, weight, and exercise? Eat a healthy diet  Eat a diet that includes plenty of vegetables, fruits, low-fat dairy products, and lean protein. Do not eat a lot of foods that are high in solid fats, added sugars, or sodium. Maintain a healthy weight Body mass index (BMI) is a measurement that can be used to identify possible weight problems. It estimates body fat based on height and weight. Your health care provider can help determine your BMI and help you achieve or maintain a healthy weight. Get regular exercise Get regular exercise. This is one of the most important things you can do for your health. Most adults should: Exercise for at least 150 minutes each week. The exercise should increase your heart rate and make you sweat (moderate-intensity exercise). Do strengthening exercises at least twice a week. This is in addition to the moderate-intensity exercise. Spend less time sitting. Even light physical activity can be beneficial. Watch cholesterol and blood lipids Have your blood tested for lipids and cholesterol at  61 years of age, then have this test every 5 years. You may need to have your cholesterol levels checked more often if: Your lipid or cholesterol levels are high. You are older than 61 years of age. You are at high risk for heart disease. What should I know about cancer screening? Many types of cancers can be detected early and may often be prevented. Depending on your health history and family history, you may need to have cancer screening at various ages. This may include screening for: Colorectal cancer. Prostate cancer. Skin cancer. Lung cancer. What should I know about heart disease, diabetes, and high blood pressure? Blood pressure and heart disease High blood pressure causes heart disease and increases the risk of stroke. This is more likely to develop in people who have high blood pressure readings or are overweight. Talk with your health care provider about your target blood pressure readings. Have your blood pressure checked: Every 3-5 years if you are 32-35 years of age. Every year if you are 73 years old or older. If you are between the ages of 68 and 68 and are a current or former smoker, ask your health care provider if you should have a one-time screening for abdominal aortic aneurysm (AAA). Diabetes Have regular diabetes screenings. This checks your fasting blood sugar level. Have the screening done: Once every three years after age 22 if you are at a normal weight and have a low risk for diabetes. More often and at a younger age if you are overweight or have a high risk for diabetes. What should I know about preventing infection? Hepatitis B If you have a  higher risk for hepatitis B, you should be screened for this virus. Talk with your health care provider to find out if you are at risk for hepatitis B infection. Hepatitis C Blood testing is recommended for: Everyone born from [birth year] through 1964. Anyone with known risk factors for hepatitis C. Sexually transmitted  infections (STIs) You should be screened each year for STIs, including gonorrhea and chlamydia, if: You are sexually active and are younger than 61 years of age. You are older than 61 years of age and your health care provider tells you that you are at risk for this type of infection. Your sexual activity has changed since you were last screened, and you are at increased risk for chlamydia or gonorrhea. Ask your health care provider if you are at risk. Ask your health care provider about whether you are at high risk for HIV. Your health care provider may recommend a prescription medicine to help prevent HIV infection. If you choose to take medicine to prevent HIV, you should first get tested for HIV. You should then be tested every 3 months for as long as you are taking the medicine. Follow these instructions at home: Alcohol use Do not drink alcohol if your health care provider tells you not to drink. If you drink alcohol: Limit how much you have to 0-2 drinks a day. Know how much alcohol is in your drink. In the [place] one drink equals one 12 oz bottle of beer (355 mL), one 5 oz glass of wine (148 mL), or one 1 oz glass of hard liquor (44 mL). Lifestyle Do not use any products that contain nicotine or tobacco. These products include cigarettes, chewing tobacco, and vaping devices, such as e-cigarettes. If you need help quitting, ask your health care provider. Do not use street drugs. Do not share needles. Ask your health care provider for help if you need support or information about quitting drugs. General instructions Schedule regular health, dental, and eye exams. Stay current with your vaccines. Tell your health care provider if: You often feel depressed. You have ever been abused or do not feel safe at home. Summary Adopting a healthy lifestyle and getting preventive care are important in promoting health and wellness. Follow your health care provider's instructions about healthy  diet, exercising, and getting tested or screened for diseases. Follow your health care provider's instructions on monitoring your cholesterol and blood pressure. This information is not intended to replace advice given to you by your health care provider. Make sure you discuss any questions you have with your health care provider. Document Revised: 02/13/2021 Document Reviewed: 02/13/2021 Elsevier Patient Education  [place].

## 2022-11-05 NOTE — Assessment & Plan Note (Signed)
HgA1C was nit at goal when checked in 08/2022, 8.9. [facility] Eye exam in 08/2022.

## 2022-11-05 NOTE — Assessment & Plan Note (Signed)
We discussed the importance of regular physical activity and healthy diet for prevention of chronic illness and/or complications. Preventive guidelines reviewed. Vaccination reported as up-to-date, getting vaccination through the [place]. Next CPE in a year.

## 2022-11-05 NOTE — Assessment & Plan Note (Signed)
On Lovastatin 10 mg daily. Last FLP at the [facility] 08/2022, LDL 85.

## 2022-11-05 NOTE — Assessment & Plan Note (Signed)
BP adequately controlled. Continue Olmesartan-HCTZ 20-12.5 mg daily. Follows with the [place].

## 2022-11-14 ENCOUNTER — Encounter: Payer: Self-pay | Admitting: Family Medicine

## 2022-11-14 ENCOUNTER — Ambulatory Visit: Payer: 59 | Admitting: Family Medicine

## 2022-11-14 DIAGNOSIS — S134XXA Sprain of ligaments of cervical spine, initial encounter: Secondary | ICD-10-CM | POA: Diagnosis not present

## 2022-11-14 DIAGNOSIS — M549 Dorsalgia, unspecified: Secondary | ICD-10-CM

## 2022-11-14 DIAGNOSIS — R519 Headache, unspecified: Secondary | ICD-10-CM | POA: Diagnosis not present

## 2022-11-14 MED ORDER — CELECOXIB 100 MG PO CAPS: 100.0000 mg | ORAL_CAPSULE | Freq: Two times a day (BID) | ORAL | 0 refills | Status: AC

## 2022-11-14 MED ORDER — METHOCARBAMOL 500 MG PO TABS: 500.0000 mg | ORAL_TABLET | Freq: Three times a day (TID) | ORAL | 0 refills | Status: AC | PRN
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
## 2022-12-14 ENCOUNTER — Other Ambulatory Visit: Payer: Self-pay | Admitting: Family Medicine

## 2022-12-14 DIAGNOSIS — E119 Type 2 diabetes mellitus without complications: Secondary | ICD-10-CM

## 2022-12-14 DIAGNOSIS — E7800 Pure hypercholesterolemia, unspecified: Secondary | ICD-10-CM

## 2023-02-05 ENCOUNTER — Encounter: Payer: Self-pay | Admitting: Internal Medicine

## 2023-03-02 ENCOUNTER — Other Ambulatory Visit: Payer: Self-pay | Admitting: Family Medicine

## 2023-03-02 DIAGNOSIS — I10 Essential (primary) hypertension: Secondary | ICD-10-CM

## 2023-03-05 ENCOUNTER — Other Ambulatory Visit: Payer: Self-pay | Admitting: Family Medicine

## 2023-04-22 ENCOUNTER — Encounter: Payer: Self-pay | Admitting: Internal Medicine

## 2023-04-23 DIAGNOSIS — M542 Cervicalgia: Secondary | ICD-10-CM | POA: Insufficient documentation

## 2023-04-23 DIAGNOSIS — I679 Cerebrovascular disease, unspecified: Secondary | ICD-10-CM | POA: Insufficient documentation

## 2023-04-23 DIAGNOSIS — E119 Type 2 diabetes mellitus without complications: Secondary | ICD-10-CM | POA: Insufficient documentation

## 2023-05-02 ENCOUNTER — Encounter: Payer: Self-pay | Admitting: Internal Medicine

## 2023-05-02 ENCOUNTER — Ambulatory Visit (AMBULATORY_SURGERY_CENTER): Payer: 59

## 2023-05-02 VITALS — Ht 67.0 in | Wt 197.0 lb

## 2023-05-02 DIAGNOSIS — Z800 Family history of malignant neoplasm of digestive organs: Secondary | ICD-10-CM

## 2023-05-02 DIAGNOSIS — Z86010 Personal history of colonic polyps: Secondary | ICD-10-CM

## 2023-05-02 MED ORDER — NA SULFATE-K SULFATE-MG SULF 17.5-3.13-1.6 GM/177ML PO SOLN: 1.0000 | Freq: Once | ORAL | 0 refills | Status: AC

## 2023-05-02 NOTE — Progress Notes (Signed)

## 2023-05-23 ENCOUNTER — Encounter: Payer: Self-pay | Admitting: Internal Medicine

## 2023-05-23 ENCOUNTER — Ambulatory Visit (AMBULATORY_SURGERY_CENTER): Payer: 59 | Admitting: Internal Medicine

## 2023-05-23 VITALS — BP 121/72 | HR 72 | Temp 97.8°F | Resp 13 | Ht 67.0 in | Wt 197.0 lb

## 2023-05-23 DIAGNOSIS — Z09 Encounter for follow-up examination after completed treatment for conditions other than malignant neoplasm: Secondary | ICD-10-CM

## 2023-05-23 DIAGNOSIS — Z86010 Personal history of colonic polyps: Secondary | ICD-10-CM

## 2023-05-23 MED ORDER — SODIUM CHLORIDE 0.9 % IV SOLN: 500.0000 mL | Freq: Once | INTRAVENOUS | Status: DC

## 2023-05-23 NOTE — Op Note (Addendum)
[facility] Patient Name: Adal Hansley Procedure Date: 05/23/2023 2:48 PM MRN: [id number] Endoscopist: Wilhemina Bonito. Marina Goodell , MD, [id number] Age: 62 Referring MD:  Date of Birth: [birth year] Gender: Male Account #: [id number] Procedure:                Colonoscopy Indications:              High risk colon cancer surveillance: Personal                            history of non-advanced adenoma. Index examination                            2017 was negative for neoplasia. Subsequent                            examination 2023 with 1 small adenoma. Fair prep.                            Thus, back for follow-up examination today. Father                            with colon cancer greater than age 29 Medicines:                Monitored Anesthesia Care Procedure:                Pre-Anesthesia Assessment:                           - Prior to the procedure, a History and Physical                            was performed, and patient medications and                            allergies were reviewed. The patient's tolerance of                            previous anesthesia was also reviewed. The risks                            and benefits of the procedure and the sedation                            options and risks were discussed with the patient.                            All questions were answered, and informed consent                            was obtained. Prior Anticoagulants: The patient has                            taken no anticoagulant or antiplatelet agents. ASA  Grade Assessment: II - A patient with mild systemic                            disease. After reviewing the risks and benefits,                            the patient was deemed in satisfactory condition to                            undergo the procedure.                           After obtaining informed consent, the colonoscope                            was passed under direct vision.  Throughout the                            procedure, the patient's blood pressure, pulse, and                            oxygen saturations were monitored continuously. The                            CF [id number] #[id number] was introduced through the anus                            and advanced to the the cecum, identified by                            appendiceal orifice and ileocecal valve. The                            ileocecal valve, appendiceal orifice, and rectum                            were photographed. The quality of the bowel                            preparation was good (would be excellent except for                            beans in the cecum). The colonoscopy was performed                            without difficulty. The patient tolerated the                            procedure well. The bowel preparation used was                            EXTRA PREP PLUS SUPREP via split dose instruction. Scope In: 2:58:30 PM Scope Out: 3:16:56 PM Scope Withdrawal Time: 0 hours 14 minutes 53  seconds  Total Procedure Duration: 0 hours 18 minutes 26 seconds  Findings:                 Internal hemorrhoids were found during retroflexion.                           The entire examined colon appeared normal on direct                            and retroflexion views. Complications:            No immediate complications. Estimated blood loss:                            None. Estimated Blood Loss:     Estimated blood loss: none. Impression:               - Internal hemorrhoids.                           - The entire examined colon is normal on direct and                            retroflexion views. Recommendation:           - Repeat colonoscopy in 7 years for surveillance.                            Will need EXTRA PREP.                           - Patient has a contact number available for                            emergencies. The signs and symptoms of potential                             delayed complications were discussed with the                            patient. Return to normal activities tomorrow.                            Written discharge instructions were provided to the                            patient.                           - Resume previous diet.                           - Continue present medications. Wilhemina Bonito. Marina Goodell, MD 05/23/2023 3:25:25 PM This report has been signed electronically.

## 2023-05-23 NOTE — Progress Notes (Signed)
Pt resting comfortably. VSS. Airway intact. SBAR complete to RN. All questions answered.   

## 2023-05-23 NOTE — Progress Notes (Signed)
HISTORY OF PRESENT ILLNESS:  Brent Cook is a 62 y.o. male with a history of adenomatous colon polyp.  Also family history of colon cancer.  Now for surveillance colonoscopy.  No complaints  REVIEW OF SYSTEMS:  All non-GI ROS negative except for  Past Medical History:  Diagnosis Date   Allergy    Arthritis    left knee   Asthma    as child   Depression    hx of-meets with therapist as needed   Diabetes mellitus    type 2-on meds   Erectile dysfunction    Hyperlipidemia    on meds   Hypertension    on meds    Past Surgical History:  Procedure Laterality Date   COLONOSCOPY  12/12/2021   also 2017- normal [facility]   meniscus tear     pt. 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 He has never been exposed to tobacco smoke. He has never used smokeless tobacco. He reports that he does not drink alcohol and does not use drugs.  family history includes Cancer in his father, maternal grandfather, and mother; Colon cancer in his father; Colon polyps (age of onset: 66) in his father.  Allergies  Allergen Reactions   Lactose Diarrhea    Other reaction(s): Abdominal pain   Other    Cat Hair Extract Rash       PHYSICAL EXAMINATION: Vital signs: BP 122/75   Pulse 87   Temp 97.8 F (36.6 C)   Ht 5\' 7"  (1.702 m)   Wt 197 lb (89.4 kg)   SpO2 96%   BMI 30.85 kg/m  General: Well-developed, well-nourished, no acute distress HEENT: Sclerae are anicteric, conjunctiva pink. Oral mucosa intact Lungs: Clear Heart: Regular Abdomen: soft, nontender, nondistended, no obvious ascites, no peritoneal signs, normal bowel sounds. No organomegaly. Extremities: No  edema Psychiatric: alert and oriented x3. Cooperative     ASSESSMENT:  History of adenomatous colon polyps Family history of colon cancer   PLAN:  Surveillance colonoscopy

## 2023-05-23 NOTE — Patient Instructions (Signed)
Please read handouts provided. Continue present medications. Repeat colonoscopy in 7 years for screening.   YOU HAD AN ENDOSCOPIC PROCEDURE TODAY AT [facility]:   Refer to the procedure report that was given to you for any specific questions about what was found during the examination.  If the procedure report does not answer your questions, please call your gastroenterologist to clarify.  If you requested that your care partner not be given the details of your procedure findings, then the procedure report has been included in a sealed envelope for you to review at your convenience later.  YOU SHOULD EXPECT: Some feelings of bloating in the abdomen. Passage of more gas than usual.  Walking can help get rid of the air that was put into your GI tract during the procedure and reduce the bloating. If you had a lower endoscopy (such as a colonoscopy or flexible sigmoidoscopy) you may notice spotting of blood in your stool or on the toilet paper. If you underwent a bowel prep for your procedure, you may not have a normal bowel movement for a few days.  Please Note:  You might notice some irritation and congestion in your nose or some drainage.  This is from the oxygen used during your procedure.  There is no need for concern and it should clear up in a day or so.  SYMPTOMS TO REPORT IMMEDIATELY:  Following lower endoscopy (colonoscopy or flexible sigmoidoscopy):  Excessive amounts of blood in the stool  Significant tenderness or worsening of abdominal pains  Swelling of the abdomen that is new, acute  Fever of 100F or higher  For urgent or emergent issues, a gastroenterologist can be reached at any hour by calling [phone]. Do not use MyChart messaging for urgent concerns.    DIET:  We do recommend a small meal at first, but then you may proceed to your regular diet.  Drink plenty of fluids but you should avoid alcoholic beverages for 24 hours.  ACTIVITY:  You should plan  to take it easy for the rest of today and you should NOT DRIVE or use heavy machinery until tomorrow (because of the sedation medicines used during the test).    FOLLOW UP: Our staff will call the number listed on your records the next business day following your procedure.  We will call around 7:15- 8:00 am to check on you and address any questions or concerns that you may have regarding the information given to you following your procedure. If we do not reach you, we will leave a message.     If any biopsies were taken you will be contacted by phone or by letter within the next 1-3 weeks.  Please call us at [phone] if you have not heard about the biopsies in 3 weeks.    SIGNATURES/CONFIDENTIALITY: You and/or your care partner have signed paperwork which will be entered into your electronic medical record.  These signatures attest to the fact that that the information above on your After Visit Summary has been reviewed and is understood.  Full responsibility of the confidentiality of this discharge information lies with you and/or your care-partner.

## 2023-05-24 ENCOUNTER — Telehealth: Payer: Self-pay

## 2023-05-24 NOTE — Telephone Encounter (Signed)
  Follow up Call-     05/23/2023    2:15 PM 12/12/2021    2:50 PM  Call back number  Post procedure Call Back phone  # [phone]  Permission to leave phone message Yes Yes     Patient questions:  Do you have a fever, pain , or abdominal swelling? No. Pain Score  0 *  Have you tolerated food without any problems? No.  Have you been able to return to your normal activities? No.  Do you have any questions about your discharge instructions: Diet   no Medications  No. Follow up visit  No.  Do you have questions or concerns about your Care? No.  Actions: * If pain score is 4 or above: No action needed, pain <4.

## 2023-05-24 NOTE — Telephone Encounter (Signed)
error 

## 2023-10-17 ENCOUNTER — Telehealth: Payer: Self-pay | Admitting: *Deleted

## 2023-10-17 NOTE — Telephone Encounter (Signed)
 Copied from CRM [id number]. Topic: Clinical - Medication Question >> Oct 17, 2023 12:26 PM [name] wrote: Reason for CRM: Patient would like to request Dr. Jordan prescribe Ozempic  for him. Please call patient to discuss and/or can call into his local [facility] on file.

## 2023-10-18 NOTE — Telephone Encounter (Signed)
 I spoke with patient. He is aware this will be discussed during his physical on 1/31.

## 2023-11-06 NOTE — Progress Notes (Signed)
HPI: Mr. Brent Cook is a 63 y.o.male with a PMHx significant for HTN, HLD, DM II, OA, and ED, who is here today for his routine physical examination.  Last CPE: 11/05/2022 Seen at the [facility] 10/07/2023. He sees them every 3 months.   Exercise: Patient states he had been walking for about an hour per day, but stopped when the weather got cold.  Diet: He sometimes cooks at home and sometimes eats out. He eats vegetables daily.  Sleep: ~7 hours per night.  Alcohol Use: none Smoking: never Vision: UTD on routine vision care.  Dental: UTD on routine dental care.  Immunization History  Administered Date(s) Administered   Influenza,inj,Quad PF,6+ Mos 10/12/2014, 07/04/2020, 09/07/2021   Moderna Covid-19 Fall Seasonal Vaccine 82yrs & older 09/13/2022   Moderna Covid-19 Vaccine Bivalent Booster 91yrs & up 09/07/2021   Moderna Sars-Covid-2 Vaccination 01/06/2020, 02/02/2020, 08/22/2020   Pneumococcal Conjugate-13 12/05/2011, 06/04/2014   Pneumococcal Polysaccharide-23 01/30/2014, 10/12/2014   Td 10/08/2001   Tdap 11/07/2011, 10/08/2012   Zoster Recombinant(Shingrix) 05/14/2017, 11/11/2017    Health Maintenance  Topic Date Due   Diabetic kidney evaluation - eGFR measurement  09/06/2022   Diabetic kidney evaluation - Urine ACR  09/06/2022   HEMOGLOBIN A1C  02/21/2023   INFLUENZA VACCINE  05/09/2023   COVID-19 Vaccine (6 - 2024-25 season) 06/09/2023   OPHTHALMOLOGY EXAM  08/29/2023   HIV Screening  09/06/2032 (Originally 02/19/1976)   FOOT EXAM  11/07/2024   Pneumococcal Vaccine 30-81 Years old (3 of 3 - PPSV23 or PCV20) 02/20/2027   Colonoscopy  05/22/2030   Hepatitis C Screening  Completed   Zoster Vaccines- Shingrix  Completed   HPV VACCINES  Aged Out   DTaP/Tdap/Td  Discontinued   Last prostate ca screening: 09/2023 at the [facility] per pt report.  Chronic medical problems:   Hypertension:  Medications: Currently on lisinopril 2.5 mg daily.  He has not been checking at home but  has it checked every three months at the [place].  Negative for unusual or severe headache, visual changes, exertional chest pain, dyspnea,  focal weakness, or edema.  Lab Results  Component Value Date   CREATININE 1.22 09/06/2021   BUN 22 09/06/2021   NA 138 09/06/2021   K 4.2 09/06/2021   CL 101 09/06/2021   CO2 29 09/06/2021   Hyperlipidemia: Currently on atorvastatin 40 mg daily.  Side effects from medication: none Lab Results  Component Value Date   CHOL 156 09/06/2021   HDL 31.70 (L) 09/06/2021   LDLCALC 100 (H) 09/06/2021   TRIG 121.0 09/06/2021   CHOLHDL 5 09/06/2021   Diabetes Mellitus II:Dx'ed about 22 years ago. - Checking BG at home: He got a new glucometer last week but hadn't been checking at home prior to that.  - Medications: Currently on Metformin ER 500 mg daily. He has not been taking Jardiance 25 mg. He would like to start Ozempic, mainly for wt loss.  - eye exam: UTD.  - foot exam: Performed today - Negative for symptoms of hypoglycemia, polyuria, polydipsia, numbness extremities, foot ulcers/trauma  Lab Results  Component Value Date   HGBA1C 8.9 08/23/2022   Lab Results  Component Value Date   MICROALBUR 6.9 (H) 09/06/2021   Insomnia:  He takes trazodone 50 mg as needed, prescribed at the [place].  Also taking aspirin 81 mg at night.   Review of Systems  Constitutional:  Negative for activity change, appetite change and fever.  HENT:  Negative for nosebleeds, sore throat  and trouble swallowing.   Eyes:  Negative for redness and visual disturbance.  Respiratory:  Negative for cough, shortness of breath and wheezing.   Cardiovascular:  Negative for chest pain, palpitations and leg swelling.  Gastrointestinal:  Negative for abdominal pain, blood in stool, nausea and vomiting.  Endocrine: Negative for cold intolerance, heat intolerance, polydipsia, polyphagia and polyuria.  Genitourinary:  Negative for decreased urine volume, dysuria, genital sores,  hematuria and testicular pain.  Musculoskeletal:  Positive for arthralgias. Negative for gait problem.  Skin:  Negative for color change and rash.  Allergic/Immunologic: Positive for environmental allergies.  Neurological:  Negative for syncope, weakness and headaches.  Hematological:  Negative for adenopathy. Does not bruise/bleed easily.  Psychiatric/Behavioral:  Positive for sleep disturbance. Negative for confusion and hallucinations.   All other systems reviewed and are negative.  Current Outpatient Medications on File Prior to Visit  Medication Sig Dispense Refill   aspirin EC 81 MG tablet Take by mouth.     atorvastatin (LIPITOR) 40 MG tablet Take by mouth.     Continuous Blood Gluc Receiver (FREESTYLE LIBRE READER) DEVI 1 Device by Does not apply route as directed. 6 Device 2   Continuous Blood Gluc Sensor (FREESTYLE LIBRE 14 DAY SENSOR) MISC USE AS DIRECTED 6 each 1   fluticasone (FLONASE) 50 MCG/ACT nasal spray SPRAY 2 SPRAYS INTO EACH NOSTRIL EVERY DAY 16 mL 2   lisinopril (ZESTRIL) 2.5 MG tablet Take by mouth.     metFORMIN (GLUCOPHAGE-XR) 500 MG 24 hr tablet TAKE 2 TABLETS (1,000 MG TOTAL) BY MOUTH DAILY WITH SUPPER 180 tablet 1   tadalafil (CIALIS) 20 MG tablet TAKE 1 TABLET BY MOUTH EVERY DAY AS NEEDED FOR ERECTILE DYSFUNCTION 6 tablet 0   traZODone (DESYREL) 50 MG tablet Take 1 tablet by mouth daily as needed.     No current facility-administered medications on file prior to visit.   Past Medical History:  Diagnosis Date   Allergy    Arthritis    left knee   Asthma    as child   Depression    hx of-meets with therapist as needed   Diabetes mellitus    type 2-on meds   Erectile dysfunction    Hyperlipidemia    on meds   Hypertension    on meds    Past Surgical History:  Procedure Laterality Date   COLONOSCOPY  12/12/2021   also 2017- normal [facility]   meniscus tear     pt. reported 10-15 years ago   TONSILLECTOMY     childhood   TOTAL KNEE ARTHROPLASTY Left  11/08/2016   Procedure: LEFT TOTAL KNEE ARTHROPLASTY;  Surgeon: Jene Every, MD;  Location: [facility];  Service: Orthopedics;  Laterality: Left;  with block   WISDOM TOOTH EXTRACTION     with sedation    Allergies  Allergen Reactions   Lactose Diarrhea    Other reaction(s): Abdominal pain   Other    Cat Dander Rash    Family History  Problem Relation Age of Onset   Cancer Mother    Colon polyps Father 20   Cancer Father    Colon cancer Father        dx'd late 74's early 70's   Cancer Maternal Grandfather    Rectal cancer Neg Hx    Stomach cancer Neg Hx    Esophageal cancer Neg Hx     Social History   Socioeconomic History   Marital status: Married    Spouse name: Not on  file   Number of children: Not on file   Years of education: Not on file   Highest education level: Not on file  Occupational History    Employer: [employer]  Tobacco Use   Smoking status: Never    Passive exposure: Never   Smokeless tobacco: Never  Vaping Use   Vaping status: Never Used  Substance and Sexual Activity   Alcohol use: Never   Drug use: Never   Sexual activity: Yes  Other Topics Concern   Not on file  Social History Narrative   Not on file   Social Drivers of Health   Financial Resource Strain: Not on file  Food Insecurity: Not on file  Transportation Needs: Not on file  Physical Activity: Not on file  Stress: Not on file  Social Connections: Unknown (02/20/2022)   Received from [facility]   Social Network    Social Network: Not on file   Vitals:   11/08/23 0804  BP: 128/80  Pulse: 100  Resp: 16  SpO2: 96%   Body mass index is 31.17 kg/m.  Wt Readings from Last 3 Encounters:  11/08/23 199 lb (90.3 kg)  05/23/23 197 lb (89.4 kg)  05/02/23 197 lb (89.4 kg)   Physical Exam Vitals and nursing note reviewed.  Constitutional:      General: He is not in acute distress.    Appearance: He is well-developed.  HENT:     Head: Normocephalic and atraumatic.      Right Ear: Tympanic membrane, ear canal and external ear normal.     Left Ear: External ear normal.     Ears:     Comments: Excess cerumen in the left ear, could not see TM    Mouth/Throat:     Mouth: Mucous membranes are moist.     Pharynx: Oropharynx is clear. Uvula midline.  Eyes:     Extraocular Movements: Extraocular movements intact.     Conjunctiva/sclera: Conjunctivae normal.     Pupils: Pupils are equal, round, and reactive to light.  Neck:     Thyroid: No thyroid mass or thyromegaly.     Trachea: No tracheal deviation.  Cardiovascular:     Rate and Rhythm: Normal rate and regular rhythm.     Pulses:          Dorsalis pedis pulses are 2+ on the right side and 2+ on the left side.     Heart sounds: No murmur heard. Pulmonary:     Effort: Pulmonary effort is normal. No respiratory distress.     Breath sounds: Normal breath sounds.  Abdominal:     Palpations: Abdomen is soft. There is no hepatomegaly or mass.     Tenderness: There is no abdominal tenderness.  Genitourinary:    Comments: No concerns. Musculoskeletal:        General: No tenderness.     Cervical back: Normal range of motion.     Right lower leg: No edema.     Left lower leg: No edema.     Comments: No signs of synovitis.  Lymphadenopathy:     Cervical: No cervical adenopathy.     Upper Body:     Right upper body: No supraclavicular adenopathy.     Left upper body: No supraclavicular adenopathy.  Skin:    General: Skin is warm.     Findings: No erythema.  Neurological:     General: No focal deficit present.     Mental Status: He is alert and  oriented to person, place, and time.     Cranial Nerves: No cranial nerve deficit.     Sensory: No sensory deficit.     Motor: No weakness.     Gait: Gait normal.     Deep Tendon Reflexes:     Reflex Scores:      Bicep reflexes are 2+ on the right side and 2+ on the left side.      Patellar reflexes are 2+ on the right side and 2+ on the left  side. Psychiatric:        Mood and Affect: Mood and affect normal.   ASSESSMENT AND PLAN:  Mr. Gillie was seen today for his routine general physical examination.   Orders Placed This Encounter  Procedures   Comprehensive metabolic panel   Microalbumin / creatinine urine ratio   Hemoglobin A1c   Lipid panel   Lab Results  Component Value Date   HGBA1C 6.9 (H) 11/08/2023   Lab Results  Component Value Date   MICROALBUR 79.9 (H) 11/08/2023   MICROALBUR 6.9 (H) 09/06/2021   Lab Results  Component Value Date   NA 138 11/08/2023   CL 100 11/08/2023   K 4.0 11/08/2023   CO2 31 11/08/2023   BUN 14 11/08/2023   CREATININE 1.05 11/08/2023   GFR 76.50 11/08/2023   CALCIUM 9.1 11/08/2023   ALBUMIN 4.2 11/08/2023   GLUCOSE 242 (H) 11/08/2023   Lab Results  Component Value Date   ALT 20 11/08/2023   AST 18 11/08/2023   ALKPHOS 103 11/08/2023   BILITOT 0.6 11/08/2023   Lab Results  Component Value Date   CHOL 126 11/08/2023   HDL 33.10 (L) 11/08/2023   LDLCALC 78 11/08/2023   TRIG 77.0 11/08/2023   CHOLHDL 4 11/08/2023   Routine general medical examination at a health care facility Assessment & Plan: We discussed the importance of regular physical activity and healthy diet for prevention of chronic illness and/or complications. Preventive guidelines reviewed. Vaccination reported as up to date, received vaccination at the [place]. Next CPE in a year.   Pure hypercholesterolemia Assessment & Plan: Continue atorvastatin 40 mg daily and low-fat diet. Further recommendation will be given according to lipid panel result.  Orders: -     Comprehensive metabolic panel; Future -     Lipid panel; Future  Type 2 diabetes mellitus with other specified complication, without long-term current use of insulin (HCC) Assessment & Plan: Co morbilities HLD and HTN. Problem is being followed at the [place]. He is requesting Ozempic, Rx sent to start 0.25 mg weekly and titrate up to 0.5 mg  in 4 weeks if well-tolerated and then up to 1 mg weekly. For now no changes in metformin. Annual eye exam, periodic dental and foot care recommended. F/U in 5-6 months  Orders: -     Semaglutide(0.25 or 0.5MG /DOS); 0.25 mg weekly [place] then 0.5 mg weekly x 4 weeks.  Dispense: 3 mL; Refill: 0 -     Comprehensive metabolic panel; Future -     Microalbumin / creatinine urine ratio; Future -     Hemoglobin A1c; Future  Essential hypertension Assessment & Plan: Overall BP adequately controlled, would like DBP < 80. Monitor BP at home. He is on Lisinopril 2.5 mg daily, prescribed at the [place]. Continue low salt diet.   Class 1 obesity due to excess calories with serious comorbidity and body mass index (BMI) of 31.0 to 31.9 in adult Assessment & Plan: He understands the benefits of  wt loss as well as adverse effects of obesity. Consistency with healthy diet and physical activity encouraged. Requesting Ozempic today for wt loss, side effects discussed. Explained that I do not prescribed it for wt loss but rather for DM II, started today.   Return in about 6 months (around 05/07/2024) for chronic problems.  I, Rolla Etienne Wierda, acting as a scribe for Shown Dissinger [place], MD., have documented all relevant documentation on the behalf of Carless Slatten [place], MD, as directed by  Reigan Tolliver [place], MD while in the presence of Tell Rozelle [place], MD.   I, Taner Rzepka [place], MD, have reviewed all documentation for this visit. The documentation on 11/08/23 for the exam, diagnosis, procedures, and orders are all accurate and complete.  Romaine Neville G. [place], MD  [facility] [place] office.

## 2023-11-08 ENCOUNTER — Ambulatory Visit (INDEPENDENT_AMBULATORY_CARE_PROVIDER_SITE_OTHER): Payer: 59 | Admitting: Family Medicine

## 2023-11-08 ENCOUNTER — Encounter: Payer: Self-pay | Admitting: Family Medicine

## 2023-11-08 VITALS — BP 128/80 | HR 100 | Resp 16 | Ht 67.0 in | Wt 199.0 lb

## 2023-11-08 DIAGNOSIS — R809 Proteinuria, unspecified: Secondary | ICD-10-CM

## 2023-11-08 DIAGNOSIS — E66811 Obesity, class 1: Secondary | ICD-10-CM | POA: Diagnosis not present

## 2023-11-08 DIAGNOSIS — E7800 Pure hypercholesterolemia, unspecified: Secondary | ICD-10-CM

## 2023-11-08 DIAGNOSIS — E6609 Other obesity due to excess calories: Secondary | ICD-10-CM

## 2023-11-08 DIAGNOSIS — Z7985 Long-term (current) use of injectable non-insulin antidiabetic drugs: Secondary | ICD-10-CM

## 2023-11-08 DIAGNOSIS — Z0000 Encounter for general adult medical examination without abnormal findings: Secondary | ICD-10-CM | POA: Diagnosis not present

## 2023-11-08 DIAGNOSIS — I10 Essential (primary) hypertension: Secondary | ICD-10-CM

## 2023-11-08 DIAGNOSIS — Z6831 Body mass index (BMI) 31.0-31.9, adult: Secondary | ICD-10-CM

## 2023-11-08 DIAGNOSIS — E1129 Type 2 diabetes mellitus with other diabetic kidney complication: Secondary | ICD-10-CM | POA: Insufficient documentation

## 2023-11-08 DIAGNOSIS — E1169 Type 2 diabetes mellitus with other specified complication: Secondary | ICD-10-CM | POA: Diagnosis not present

## 2023-11-08 LAB — LIPID PANEL
Cholesterol: 126 mg/dL (ref 0–200)
HDL: 33.1 mg/dL — ABNORMAL LOW (ref >39.00)
LDL Cholesterol: 78 mg/dL (ref 0–99)
NonHDL: 93.14
Total CHOL/HDL Ratio: 4
Triglycerides: 77 mg/dL (ref 0.0–149.0)
VLDL: 15.4 mg/dL (ref 0.0–40.0)

## 2023-11-08 LAB — MICROALBUMIN / CREATININE URINE RATIO
Creatinine,U: 190.2 mg/dL
Microalb Creat Ratio: 42 mg/g — ABNORMAL HIGH (ref 0.0–30.0)
Microalb, Ur: 79.9 mg/dL — ABNORMAL HIGH (ref 0.0–1.9)

## 2023-11-08 LAB — COMPREHENSIVE METABOLIC PANEL
ALT: 20 U/L (ref 0–53)
AST: 18 U/L (ref 0–37)
Albumin: 4.2 g/dL (ref 3.5–5.2)
Alkaline Phosphatase: 103 U/L (ref 39–117)
BUN: 14 mg/dL (ref 6–23)
CO2: 31 meq/L (ref 19–32)
Calcium: 9.1 mg/dL (ref 8.4–10.5)
Chloride: 100 meq/L (ref 96–112)
Creatinine, Ser: 1.05 mg/dL (ref 0.40–1.50)
GFR: 76.5 mL/min (ref >60.00)
Glucose, Bld: 242 mg/dL — ABNORMAL HIGH (ref 70–99)
Potassium: 4 meq/L (ref 3.5–5.1)
Sodium: 138 meq/L (ref 135–145)
Total Bilirubin: 0.6 mg/dL (ref 0.2–1.2)
Total Protein: 7 g/dL (ref 6.0–8.3)

## 2023-11-08 LAB — HEMOGLOBIN A1C: Hgb A1c MFr Bld: 6.9 % — ABNORMAL HIGH (ref 4.6–6.5)

## 2023-11-08 MED ORDER — SEMAGLUTIDE(0.25 OR 0.5MG/DOS) 2 MG/3ML ~~LOC~~ SOPN
PEN_INJECTOR | SUBCUTANEOUS | 0 refills | Status: DC
Start: 2023-11-08 — End: 2023-12-06

## 2023-11-08 NOTE — Assessment & Plan Note (Signed)
Continue atorvastatin 40 mg daily and low-fat diet. Further recommendation will be given according to lipid panel result. 

## 2023-11-08 NOTE — Assessment & Plan Note (Signed)
Co morbilities HLD and HTN. Problem is being followed at the [place]. He is requesting Ozempic, Rx sent to start 0.25 mg weekly and titrate up to 0.5 mg in 4 weeks if well-tolerated and then up to 1 mg weekly. For now no changes in metformin. Annual eye exam, periodic dental and foot care recommended. F/U in 5-6 months

## 2023-11-08 NOTE — Assessment & Plan Note (Signed)
We discussed the importance of regular physical activity and healthy diet for prevention of chronic illness and/or complications. Preventive guidelines reviewed. Vaccination reported as up to date, received vaccination at the [place]. Next CPE in a year.

## 2023-11-08 NOTE — Assessment & Plan Note (Addendum)
Overall BP adequately controlled, would like DBP < 80. Monitor BP at home. He is on Lisinopril 2.5 mg daily, prescribed at the [place]. Continue low salt diet.

## 2023-11-08 NOTE — Patient Instructions (Addendum)
A few things to remember from today's visit:  Routine general medical examination at a health care facility  Pure hypercholesterolemia - Plan: Comprehensive metabolic panel, Lipid panel  Type 2 diabetes mellitus with other specified complication, without long-term current use of insulin (HCC) - Plan: Semaglutide,0.25 or 0.5MG /DOS, 2 MG/3ML SOPN, Comprehensive metabolic panel, Microalbumin / creatinine urine ratio, Hemoglobin A1c  Ozempic started today, start 0.25 mg and increase dose as tolerated, goal 1 mg weekly. No changes in Metformin for now. Monitor blood pressure at home, goal is under 130/80. If you need refills for medications you take chronically, please call your pharmacy. Do not use My Chart to request refills or for acute issues that need immediate attention. If you send a my chart message, it may take a few days to be addressed, specially if I am not in the office.  Please be sure medication list is accurate. If a new problem present, please set up appointment sooner than planned today.  Health Maintenance, Male Adopting a healthy lifestyle and getting preventive care are important in promoting health and wellness. Ask your health care provider about: The right schedule for you to have regular tests and exams. Things you can do on your own to prevent diseases and keep yourself healthy. What should I know about diet, weight, and exercise? Eat a healthy diet  Eat a diet that includes plenty of vegetables, fruits, low-fat dairy products, and lean protein. Do not eat a lot of foods that are high in solid fats, added sugars, or sodium. Maintain a healthy weight Body mass index (BMI) is a measurement that can be used to identify possible weight problems. It estimates body fat based on height and weight. Your health care provider can help determine your BMI and help you achieve or maintain a healthy weight. Get regular exercise Get regular exercise. This is one of the most  important things you can do for your health. Most adults should: Exercise for at least 150 minutes each week. The exercise should increase your heart rate and make you sweat (moderate-intensity exercise). Do strengthening exercises at least twice a week. This is in addition to the moderate-intensity exercise. Spend less time sitting. Even light physical activity can be beneficial. Watch cholesterol and blood lipids Have your blood tested for lipids and cholesterol at 62 years of age, then have this test every 5 years. You may need to have your cholesterol levels checked more often if: Your lipid or cholesterol levels are high. You are older than 62 years of age. You are at high risk for heart disease. What should I know about cancer screening? Many types of cancers can be detected early and may often be prevented. Depending on your health history and family history, you may need to have cancer screening at various ages. This may include screening for: Colorectal cancer. Prostate cancer. Skin cancer. Lung cancer. What should I know about heart disease, diabetes, and high blood pressure? Blood pressure and heart disease High blood pressure causes heart disease and increases the risk of stroke. This is more likely to develop in people who have high blood pressure readings or are overweight. Talk with your health care provider about your target blood pressure readings. Have your blood pressure checked: Every 3-5 years if you are 74-96 years of age. Every year if you are 62 years old or older. If you are between the ages of 26 and 39 and are a current or former smoker, ask your health care provider if you  should have a one-time screening for abdominal aortic aneurysm (AAA). Diabetes Have regular diabetes screenings. This checks your fasting blood sugar level. Have the screening done: Once every three years after age 62 if you are at a normal weight and have a low risk for diabetes. More often  and at a younger age if you are overweight or have a high risk for diabetes. What should I know about preventing infection? Hepatitis B If you have a higher risk for hepatitis B, you should be screened for this virus. Talk with your health care provider to find out if you are at risk for hepatitis B infection. Hepatitis C Blood testing is recommended for: Everyone born from [birth year] through 1964. Anyone with known risk factors for hepatitis C. Sexually transmitted infections (STIs) You should be screened each year for STIs, including gonorrhea and chlamydia, if: You are sexually active and are younger than 62 years of age. You are older than 62 years of age and your health care provider tells you that you are at risk for this type of infection. Your sexual activity has changed since you were last screened, and you are at increased risk for chlamydia or gonorrhea. Ask your health care provider if you are at risk. Ask your health care provider about whether you are at high risk for HIV. Your health care provider may recommend a prescription medicine to help prevent HIV infection. If you choose to take medicine to prevent HIV, you should first get tested for HIV. You should then be tested every 3 months for as long as you are taking the medicine. Follow these instructions at home: Alcohol use Do not drink alcohol if your health care provider tells you not to drink. If you drink alcohol: Limit how much you have to 0-2 drinks a day. Know how much alcohol is in your drink. In the [place] one drink equals one 12 oz bottle of beer (355 mL), one 5 oz glass of wine (148 mL), or one 1 oz glass of hard liquor (44 mL). Lifestyle Do not use any products that contain nicotine or tobacco. These products include cigarettes, chewing tobacco, and vaping devices, such as e-cigarettes. If you need help quitting, ask your health care provider. Do not use street drugs. Do not share needles. Ask your health care provider  for help if you need support or information about quitting drugs. General instructions Schedule regular health, dental, and eye exams. Stay current with your vaccines. Tell your health care provider if: You often feel depressed. You have ever been abused or do not feel safe at home. Summary Adopting a healthy lifestyle and getting preventive care are important in promoting health and wellness. Follow your health care provider's instructions about healthy diet, exercising, and getting tested or screened for diseases. Follow your health care provider's instructions on monitoring your cholesterol and blood pressure. This information is not intended to replace advice given to you by your health care provider. Make sure you discuss any questions you have with your health care provider. Document Revised: 02/13/2021 Document Reviewed: 02/13/2021 Elsevier Patient Education  2024 [employer].

## 2023-11-08 NOTE — Assessment & Plan Note (Signed)
He understands the benefits of wt loss as well as adverse effects of obesity. Consistency with healthy diet and physical activity encouraged. Requesting Ozempic today for wt loss, side effects discussed. Explained that I do not prescribed it for wt loss but rather for DM II, started today.

## 2023-12-05 ENCOUNTER — Other Ambulatory Visit: Payer: Self-pay | Admitting: Family Medicine

## 2023-12-05 DIAGNOSIS — E1169 Type 2 diabetes mellitus with other specified complication: Secondary | ICD-10-CM

## 2023-12-17 ENCOUNTER — Other Ambulatory Visit: Payer: Self-pay | Admitting: Family Medicine

## 2023-12-17 DIAGNOSIS — E7800 Pure hypercholesterolemia, unspecified: Secondary | ICD-10-CM

## 2023-12-17 DIAGNOSIS — E119 Type 2 diabetes mellitus without complications: Secondary | ICD-10-CM

## 2024-02-06 ENCOUNTER — Other Ambulatory Visit: Payer: Self-pay | Admitting: Family Medicine

## 2024-02-06 DIAGNOSIS — E1169 Type 2 diabetes mellitus with other specified complication: Secondary | ICD-10-CM

## 2024-02-29 ENCOUNTER — Other Ambulatory Visit: Payer: Self-pay | Admitting: Family Medicine

## 2024-02-29 DIAGNOSIS — I10 Essential (primary) hypertension: Secondary | ICD-10-CM

## 2024-05-08 ENCOUNTER — Ambulatory Visit: Payer: 59 | Admitting: Family Medicine

## 2024-05-08 ENCOUNTER — Ambulatory Visit: Payer: Self-pay | Admitting: Family Medicine

## 2024-05-08 ENCOUNTER — Encounter: Payer: Self-pay | Admitting: Family Medicine

## 2024-05-08 VITALS — BP 136/80 | HR 90 | Resp 16 | Ht 67.0 in | Wt 200.1 lb

## 2024-05-08 DIAGNOSIS — R809 Proteinuria, unspecified: Secondary | ICD-10-CM

## 2024-05-08 DIAGNOSIS — E1169 Type 2 diabetes mellitus with other specified complication: Secondary | ICD-10-CM | POA: Diagnosis not present

## 2024-05-08 DIAGNOSIS — Z7984 Long term (current) use of oral hypoglycemic drugs: Secondary | ICD-10-CM

## 2024-05-08 DIAGNOSIS — I10 Essential (primary) hypertension: Secondary | ICD-10-CM

## 2024-05-08 DIAGNOSIS — E7800 Pure hypercholesterolemia, unspecified: Secondary | ICD-10-CM

## 2024-05-08 LAB — MICROALBUMIN / CREATININE URINE RATIO
Creatinine,U: 162.6 mg/dL
Microalb Creat Ratio: 420.1 mg/g — ABNORMAL HIGH (ref 0.0–30.0)
Microalb, Ur: 68.3 mg/dL — ABNORMAL HIGH (ref 0.0–1.9)

## 2024-05-08 LAB — POCT GLYCOSYLATED HEMOGLOBIN (HGB A1C): HbA1c, POC (prediabetic range): 6.3 % (ref 5.7–6.4)

## 2024-05-08 MED ORDER — OZEMPIC (0.25 OR 0.5 MG/DOSE) 2 MG/3ML ~~LOC~~ SOPN: 0.5000 mg | PEN_INJECTOR | SUBCUTANEOUS | 3 refills | Status: AC

## 2024-05-08 MED ORDER — LISINOPRIL 2.5 MG PO TABS: 2.5000 mg | ORAL_TABLET | Freq: Every day | ORAL | 2 refills | Status: DC

## 2024-05-08 NOTE — Assessment & Plan Note (Signed)
 Mildly elevated today, reporting lower BPs at work. Continue lisinopril 2.5 mg daily and low-salt diet.

## 2024-05-08 NOTE — Patient Instructions (Addendum)
 A few things to remember from today's visit:  Type 2 diabetes mellitus with other specified complication, without long-term current use of insulin  (HCC) - Plan: POC HgB A1c, Microalbumin / creatinine urine ratio, Semaglutide ,0.25 or 0.5MG /DOS, (OZEMPIC , 0.25 OR 0.5 MG/DOSE,) 2 MG/3ML SOPN  Essential hypertension - Plan: lisinopril (ZESTRIL) 2.5 MG tablet  No change today.  If you need refills for medications you take chronically, please call your pharmacy. Do not use My Chart to request refills or for acute issues that need immediate attention. If you send a my chart message, it may take a few days to be addressed, specially if I am not in the office.  Please be sure medication list is accurate. If a new problem present, please set up appointment sooner than planned today.

## 2024-05-08 NOTE — Progress Notes (Signed)
 HPI: [name] is a 63 y.o. male with a PMHx significant for HTN, HLD, DM II, OA, and ED, who is here today for chronic disease management.  Last seen on 11/08/23  He also regularly follows up with Brent Cook at the [facility]   Diabetes Mellitus II: Dx'ed about 22 years ago. - Checking BG at home: Running 110-130s  - Medications: Currently on Metformin  1,000 mg once daily and Ozempic  0.5 mg once weekly.  - Good compliance and tolerance.  - Diet: Overall healthy diet, but notes he did not eat healthy yesterday.  - Exercise: Walking every Saturday and Sunday for 2 hours. If able on weekdays, he also walks for 1 hour in the evening.  - Eye exam: UTD, gets exams every 3 months at the [facility]  - Negative for symptoms of hypoglycemia, polyuria, polydipsia, numbness extremities, foot ulcers/trauma  Lab Results  Component Value Date   HGBA1C 6.3 05/08/2024   Hypertension:  Medications: Lisinopril 2.5 mg once daily.  Side effects: None BP is checked by a nurse at his workplace, typically after work. Pt states readings tend to be at goal with systolic readings averaging in the 120s.  BP yesterday was 111/70. Negative for unusual or severe headache, visual changes, exertional chest pain, dyspnea,  focal weakness, or edema. Lab Results  Component Value Date   CREATININE 1.05 11/08/2023   BUN 14 11/08/2023   NA 138 11/08/2023   K 4.0 11/08/2023   CL 100 11/08/2023   CO2 31 11/08/2023   Review of Systems  Constitutional:  Negative for activity change, appetite change and fever.  HENT:  Negative for mouth sores and sore throat.   Respiratory:  Negative for cough and wheezing.   Gastrointestinal:  Negative for abdominal pain, nausea and vomiting.  Endocrine: Negative for cold intolerance and heat intolerance.  Genitourinary:  Negative for decreased urine volume, dysuria and hematuria.  Skin:  Negative for rash.  Neurological:  Negative for syncope and facial asymmetry.  See other  pertinent positives and negatives in HPI.  Current Outpatient Medications on File Prior to Visit  Medication Sig Dispense Refill   aspirin  EC 81 MG tablet Take by mouth.     atorvastatin (LIPITOR) 40 MG tablet Take by mouth.     Continuous Blood Gluc Receiver (FREESTYLE LIBRE READER) DEVI 1 Device by Does not apply route as directed. 6 Device 2   Continuous Blood Gluc Sensor (FREESTYLE LIBRE 14 DAY SENSOR) MISC USE AS DIRECTED 6 each 1   fluticasone  (FLONASE ) 50 MCG/ACT nasal spray SPRAY 2 SPRAYS INTO EACH NOSTRIL EVERY DAY 16 mL 2   metFORMIN  (GLUCOPHAGE -XR) 500 MG 24 hr tablet TAKE 2 TABLETS (1,000 MG TOTAL) BY MOUTH DAILY WITH SUPPER 180 tablet 1   tadalafil  (CIALIS ) 20 MG tablet TAKE 1 TABLET BY MOUTH EVERY DAY AS NEEDED FOR ERECTILE DYSFUNCTION 6 tablet 0   traZODone (DESYREL) 50 MG tablet Take 1 tablet by mouth daily as needed.     No current facility-administered medications on file prior to visit.    Past Medical History:  Diagnosis Date   Allergy    Arthritis    left knee   Asthma    as child   Depression    hx of-meets with therapist as needed   Diabetes mellitus    type 2-on meds   Erectile dysfunction    Hyperlipidemia    on meds   Hypertension    on meds   Allergies  Allergen Reactions  Lactose Diarrhea    Other reaction(s): Abdominal pain   Other    Cat Dander Rash    Social History   Socioeconomic History   Marital status: Married    Spouse name: Not on file   Number of children: Not on file   Years of education: Not on file   Highest education level: Not on file  Occupational History    Employer: [employer]  Tobacco Use   Smoking status: Never    Passive exposure: Never   Smokeless tobacco: Never  Vaping Use   Vaping status: Never Used  Substance and Sexual Activity   Alcohol use: Never   Drug use: Never   Sexual activity: Yes  Other Topics Concern   Not on file  Social History Narrative   Not on file   Social Drivers of Health   Financial  Resource Strain: Not on file  Food Insecurity: Not on file  Transportation Needs: Not on file  Physical Activity: Not on file  Stress: Not on file  Social Connections: Unknown (02/20/2022)   Received from [facility]   Social Network    Social Network: Not on file    Vitals:   05/08/24 0751  BP: 136/80  Pulse: 90  Resp: 16  SpO2: 96%   Body mass index is 31.34 kg/m.  Physical Exam Vitals and nursing note reviewed.  Constitutional:      General: He is not in acute distress.    Appearance: He is well-developed.  HENT:     Head: Normocephalic and atraumatic.     Mouth/Throat:     Mouth: Mucous membranes are moist.     Pharynx: Oropharynx is clear.  Eyes:     Conjunctiva/sclera: Conjunctivae normal.  Cardiovascular:     Rate and Rhythm: Normal rate and regular rhythm.     Pulses:          Dorsalis pedis pulses are 2+ on the right side and 2+ on the left side.     Heart sounds: No murmur heard. Pulmonary:     Effort: Pulmonary effort is normal. No respiratory distress.     Breath sounds: Normal breath sounds.  Abdominal:     Palpations: Abdomen is soft. There is no hepatomegaly or mass.     Tenderness: There is no abdominal tenderness.  Lymphadenopathy:     Cervical: No cervical adenopathy.  Skin:    General: Skin is warm.     Findings: No erythema or rash.  Neurological:     Mental Status: He is alert and oriented to person, place, and time.     Cranial Nerves: No cranial nerve deficit.     Gait: Gait normal.  Psychiatric:        Mood and Affect: Mood and affect normal.    ASSESSMENT AND PLAN:  Mr. Brent Cook was seen today for chronic disease management.  Orders Placed This Encounter  Procedures   Microalbumin / creatinine urine ratio   POC HgB A1c   Lab Results  Component Value Date   HGBA1C 6.3 05/08/2024   Lab Results  Component Value Date   MICROALBUR 68.3 (H) 05/08/2024   Type 2 diabetes mellitus with other specified complication,  without long-term current use of insulin  (HCC) Assessment & Plan: HgA1C at goal, it went from 6.9 to 6.3. Continue metformin  ER 500 mg 2 tablets daily and semaglutide  0.5 mg weekly. Annual eye exam, periodic dental and foot care to continue. F/U in 5-6 months.  Orders: -  POCT glycosylated hemoglobin (Hb A1C) -     Microalbumin / creatinine urine ratio; Future -     Ozempic  (0.25 or 0.5 MG/DOSE); Inject 0.5 mg into the skin once a week.  Dispense: 3 mL; Refill: 3  Essential hypertension Assessment & Plan: Mildly elevated today, reporting lower BPs at work. Continue lisinopril 2.5 mg daily and low-salt diet.  Orders: -     Lisinopril; Take 1 tablet (2.5 mg total) by mouth daily.  Dispense: 90 tablet; Refill: 2  Return in about 27 weeks (around 11/13/2024) for CPE.  I, Brent Cook, acting as a scribe for Brent Kenner [place], MD., have documented all relevant documentation on the behalf of Brent Geissinger [place], MD, as directed by   while in the presence of Brent Muralles [place], MD.  I, Brent Fauth [place], MD, have reviewed all documentation for this visit. The documentation on 05/08/24 for the exam, diagnosis, procedures, and orders are all accurate and complete.   Brent Boline G. [place], MD  [facility] office

## 2024-05-08 NOTE — Assessment & Plan Note (Signed)
 HgA1C at goal, it went from 6.9 to 6.3. Continue metformin  ER 500 mg 2 tablets daily and semaglutide  0.5 mg weekly. Annual eye exam, periodic dental and foot care to continue. F/U in 5-6 months.

## 2024-05-11 ENCOUNTER — Other Ambulatory Visit: Payer: Self-pay

## 2024-05-11 DIAGNOSIS — E119 Type 2 diabetes mellitus without complications: Secondary | ICD-10-CM

## 2024-05-11 MED ORDER — DAPAGLIFLOZIN PROPANEDIOL 10 MG PO TABS: 10.0000 mg | ORAL_TABLET | Freq: Every day | ORAL | 3 refills | Status: AC

## 2024-05-11 MED ORDER — LISINOPRIL 5 MG PO TABS
5.0000 mg | ORAL_TABLET | Freq: Every day | ORAL | 3 refills | Status: AC
Start: 2024-05-11 — End: ?

## 2024-05-11 MED ORDER — METFORMIN HCL ER 500 MG PO TB24: 500.0000 mg | ORAL_TABLET | Freq: Every day | ORAL | Status: AC

## 2024-06-09 ENCOUNTER — Other Ambulatory Visit (INDEPENDENT_AMBULATORY_CARE_PROVIDER_SITE_OTHER)

## 2024-06-09 DIAGNOSIS — R809 Proteinuria, unspecified: Secondary | ICD-10-CM | POA: Diagnosis not present

## 2024-06-09 DIAGNOSIS — I10 Essential (primary) hypertension: Secondary | ICD-10-CM

## 2024-06-09 LAB — BASIC METABOLIC PANEL WITH GFR
BUN: 15 mg/dL (ref 6–23)
CO2: 28 meq/L (ref 19–32)
Calcium: 9.2 mg/dL (ref 8.4–10.5)
Chloride: 100 meq/L (ref 96–112)
Creatinine, Ser: 1.03 mg/dL (ref 0.40–1.50)
GFR: 77.97 mL/min (ref >60.00)
Glucose, Bld: 165 mg/dL — ABNORMAL HIGH (ref 70–99)
Potassium: 3.8 meq/L (ref 3.5–5.1)
Sodium: 138 meq/L (ref 135–145)

## 2024-06-09 LAB — CBC
HCT: 38.7 % — ABNORMAL LOW (ref 39.0–52.0)
Hemoglobin: 13.7 g/dL (ref 13.0–17.0)
MCHC: 35.3 g/dL (ref 30.0–36.0)
MCV: 91.8 fl (ref 78.0–100.0)
Platelets: 204 K/uL (ref 150.0–400.0)
RBC: 4.22 Mil/uL (ref 4.22–5.81)
RDW: 12.8 % (ref 11.5–15.5)
WBC: 3.9 K/uL — ABNORMAL LOW (ref 4.0–10.5)

## 2024-06-10 LAB — PROTEIN / CREATININE RATIO, URINE
Creatinine, Urine: 57 mg/dL (ref 20–320)
Protein/Creat Ratio: 526 mg/g{creat} — ABNORMAL HIGH (ref 25–148)
Protein/Creatinine Ratio: 0.526 mg/mg{creat} — ABNORMAL HIGH (ref 0.025–0.148)
Total Protein, Urine: 30 mg/dL — ABNORMAL HIGH (ref 5–25)

## 2024-07-10 ENCOUNTER — Telehealth: Payer: Self-pay

## 2024-07-10 NOTE — Telephone Encounter (Signed)
 Documented in results note 05/08/2024.    Copied from CRM #[id number]. Topic: General - Other >> Jul 10, 2024 12:22 PM Suzen RAMAN wrote: Reason for CRM:  Patient return call requesting to speak back with Christie(Amol Domanski) per VM left for him to return call.

## 2024-08-27 ENCOUNTER — Other Ambulatory Visit: Payer: Self-pay | Admitting: Family Medicine

## 2024-08-27 DIAGNOSIS — I10 Essential (primary) hypertension: Secondary | ICD-10-CM

## 2024-08-27 NOTE — Telephone Encounter (Signed)
 Copied from CRM [id number]. Topic: Clinical - Medication Refill >> Aug 27, 2024  1:13 PM Charlet HERO wrote: Medication: hydrochlorothiazide  (HYDRODIURIL ) 25 MG tablet [[id number]] DISCONTINUED  Has the patient contacted their pharmacy? Yes 0 refilled  This is the patient's preferred pharmacy:  [id number] [address] [place], [place] - [address] [place] [address] Phone: [phone] Fax: [phone]  Is this the correct pharmacy for this prescription? Yes If no, delete pharmacy and type the correct one.   Has the prescription been filled recently? No  Is the patient out of the medication? Yes  Has the patient been seen for an appointment in the last year OR does the patient have an upcoming appointment? Yes  Can we respond through MyChart? No  Agent: Please be advised that Rx refills may take up to 3 business days. We ask that you follow-up with your pharmacy.
